# Patient Record
Sex: Male | Born: 1950 | Race: White | Hispanic: No | Marital: Married | Smoking: Former smoker
Health system: Southern US, Community
[De-identification: ages and names within clinical notes are randomized; demographics above are authoritative.]

## PROBLEM LIST (undated history)

## (undated) DIAGNOSIS — E119 Type 2 diabetes mellitus without complications: Secondary | ICD-10-CM

## (undated) DIAGNOSIS — I10 Essential (primary) hypertension: Secondary | ICD-10-CM

## (undated) DIAGNOSIS — G4733 Obstructive sleep apnea (adult) (pediatric): Secondary | ICD-10-CM

## (undated) HISTORY — DX: Type 2 diabetes mellitus without complications: E11.9

---

## 2014-08-18 ENCOUNTER — Ambulatory Visit (INDEPENDENT_AMBULATORY_CARE_PROVIDER_SITE_OTHER): Payer: Self-pay | Admitting: Physician Assistant

## 2014-08-18 VITALS — BP 142/80 | HR 82 | Temp 98.9°F | Resp 18 | Ht 71.25 in | Wt 250.8 lb

## 2014-08-18 DIAGNOSIS — Z0283 Encounter for blood-alcohol and blood-drug test: Secondary | ICD-10-CM

## 2014-08-18 DIAGNOSIS — Z0189 Encounter for other specified special examinations: Secondary | ICD-10-CM

## 2014-08-18 DIAGNOSIS — R1011 Right upper quadrant pain: Secondary | ICD-10-CM
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
## 2014-12-28 ENCOUNTER — Encounter (INDEPENDENT_AMBULATORY_CARE_PROVIDER_SITE_OTHER): Payer: 59 | Admitting: Ophthalmology

## 2014-12-28 DIAGNOSIS — E11311 Type 2 diabetes mellitus with unspecified diabetic retinopathy with macular edema: Secondary | ICD-10-CM

## 2014-12-28 DIAGNOSIS — H43813 Vitreous degeneration, bilateral: Secondary | ICD-10-CM | POA: Diagnosis not present

## 2014-12-28 DIAGNOSIS — E11331 Type 2 diabetes mellitus with moderate nonproliferative diabetic retinopathy with macular edema: Secondary | ICD-10-CM

## 2014-12-28 DIAGNOSIS — H3531 Nonexudative age-related macular degeneration: Secondary | ICD-10-CM | POA: Diagnosis not present

## 2014-12-28 DIAGNOSIS — E11339 Type 2 diabetes mellitus with moderate nonproliferative diabetic retinopathy without macular edema: Secondary | ICD-10-CM | POA: Diagnosis not present

## 2014-12-28 DIAGNOSIS — H2513 Age-related nuclear cataract, bilateral: Secondary | ICD-10-CM

## 2014-12-28 DIAGNOSIS — H35033 Hypertensive retinopathy, bilateral: Secondary | ICD-10-CM | POA: Diagnosis not present

## 2014-12-28 DIAGNOSIS — I10 Essential (primary) hypertension: Secondary | ICD-10-CM

## 2016-01-24 ENCOUNTER — Other Ambulatory Visit: Payer: Self-pay | Admitting: Adult Health

## 2016-01-24 ENCOUNTER — Ambulatory Visit (INDEPENDENT_AMBULATORY_CARE_PROVIDER_SITE_OTHER): Payer: Self-pay

## 2016-01-24 DIAGNOSIS — Z021 Encounter for pre-employment examination: Secondary | ICD-10-CM

## 2018-10-02 ENCOUNTER — Emergency Department (HOSPITAL_COMMUNITY): Payer: No Typology Code available for payment source

## 2018-10-02 ENCOUNTER — Inpatient Hospital Stay (HOSPITAL_COMMUNITY): Payer: No Typology Code available for payment source | Admitting: Anesthesiology

## 2018-10-02 ENCOUNTER — Inpatient Hospital Stay (HOSPITAL_COMMUNITY): Payer: No Typology Code available for payment source

## 2018-10-02 ENCOUNTER — Encounter (HOSPITAL_COMMUNITY): Payer: Self-pay | Admitting: Emergency Medicine

## 2018-10-02 ENCOUNTER — Inpatient Hospital Stay (HOSPITAL_COMMUNITY)
Admission: EM | Admit: 2018-10-02 | Discharge: 2018-10-05 | DRG: 481 | Disposition: A | Discharge status: Skilled Nursing Facility | Payer: No Typology Code available for payment source | Attending: Family Medicine | Admitting: Family Medicine

## 2018-10-02 ENCOUNTER — Other Ambulatory Visit: Payer: Self-pay

## 2018-10-02 ENCOUNTER — Encounter (HOSPITAL_COMMUNITY)
Admission: EM | Disposition: A | Discharge status: Skilled Nursing Facility | Payer: Self-pay | Source: Home / Self Care | Attending: Family Medicine

## 2018-10-02 DIAGNOSIS — R339 Retention of urine, unspecified: Secondary | ICD-10-CM | POA: Diagnosis present

## 2018-10-02 DIAGNOSIS — N179 Acute kidney failure, unspecified: Secondary | ICD-10-CM | POA: Diagnosis not present

## 2018-10-02 DIAGNOSIS — I10 Essential (primary) hypertension: Secondary | ICD-10-CM | POA: Diagnosis present

## 2018-10-02 DIAGNOSIS — Z79899 Other long term (current) drug therapy: Secondary | ICD-10-CM | POA: Diagnosis not present

## 2018-10-02 DIAGNOSIS — S72002A Fracture of unspecified part of neck of left femur, initial encounter for closed fracture: Secondary | ICD-10-CM | POA: Diagnosis present

## 2018-10-02 DIAGNOSIS — I959 Hypotension, unspecified: Secondary | ICD-10-CM | POA: Diagnosis not present

## 2018-10-02 DIAGNOSIS — Z7982 Long term (current) use of aspirin: Secondary | ICD-10-CM

## 2018-10-02 DIAGNOSIS — R0902 Hypoxemia: Secondary | ICD-10-CM | POA: Diagnosis present

## 2018-10-02 DIAGNOSIS — W19XXXA Unspecified fall, initial encounter: Secondary | ICD-10-CM

## 2018-10-02 DIAGNOSIS — M1611 Unilateral primary osteoarthritis, right hip: Secondary | ICD-10-CM | POA: Diagnosis present

## 2018-10-02 DIAGNOSIS — Z7984 Long term (current) use of oral hypoglycemic drugs: Secondary | ICD-10-CM | POA: Diagnosis not present

## 2018-10-02 DIAGNOSIS — Y93K1 Activity, walking an animal: Secondary | ICD-10-CM

## 2018-10-02 DIAGNOSIS — S72142A Displaced intertrochanteric fracture of left femur, initial encounter for closed fracture: Secondary | ICD-10-CM

## 2018-10-02 DIAGNOSIS — E119 Type 2 diabetes mellitus without complications: Secondary | ICD-10-CM

## 2018-10-02 DIAGNOSIS — Z09 Encounter for follow-up examination after completed treatment for conditions other than malignant neoplasm: Secondary | ICD-10-CM

## 2018-10-02 DIAGNOSIS — R296 Repeated falls: Secondary | ICD-10-CM | POA: Diagnosis present

## 2018-10-02 DIAGNOSIS — Z419 Encounter for procedure for purposes other than remedying health state, unspecified: Secondary | ICD-10-CM

## 2018-10-02 DIAGNOSIS — E1140 Type 2 diabetes mellitus with diabetic neuropathy, unspecified: Secondary | ICD-10-CM | POA: Diagnosis present

## 2018-10-02 DIAGNOSIS — G9340 Encephalopathy, unspecified: Secondary | ICD-10-CM | POA: Diagnosis not present

## 2018-10-02 DIAGNOSIS — W1830XA Fall on same level, unspecified, initial encounter: Secondary | ICD-10-CM | POA: Diagnosis present

## 2018-10-02 DIAGNOSIS — F4320 Adjustment disorder, unspecified: Secondary | ICD-10-CM | POA: Diagnosis present

## 2018-10-02 DIAGNOSIS — Z961 Presence of intraocular lens: Secondary | ICD-10-CM | POA: Diagnosis present

## 2018-10-02 DIAGNOSIS — G4733 Obstructive sleep apnea (adult) (pediatric): Secondary | ICD-10-CM | POA: Diagnosis present

## 2018-10-02 HISTORY — PX: FEMUR IM NAIL: SHX1597

## 2018-10-02 HISTORY — DX: Obstructive sleep apnea (adult) (pediatric): G47.33

## 2018-10-02 LAB — CBC WITH DIFFERENTIAL/PLATELET
Abs Immature Granulocytes: 0.11 10*3/uL — ABNORMAL HIGH (ref 0.00–0.07)
Basophils Absolute: 0.1 10*3/uL (ref 0.0–0.1)
Basophils Relative: 1 %
Eosinophils Absolute: 0.4 10*3/uL (ref 0.0–0.5)
Eosinophils Relative: 4 %
HCT: 40.6 % (ref 39.0–52.0)
Hemoglobin: 13.1 g/dL (ref 13.0–17.0)
Immature Granulocytes: 1 %
Lymphocytes Relative: 13 %
Lymphs Abs: 1.1 10*3/uL (ref 0.7–4.0)
MCH: 30.6 pg (ref 26.0–34.0)
MCHC: 32.3 g/dL (ref 30.0–36.0)
MCV: 94.9 fL (ref 80.0–100.0)
Monocytes Absolute: 0.5 10*3/uL (ref 0.1–1.0)
Monocytes Relative: 6 %
NRBC: 0 % (ref 0.0–0.2)
Neutro Abs: 6.5 10*3/uL (ref 1.7–7.7)
Neutrophils Relative %: 75 %
Platelets: 176 10*3/uL (ref 150–400)
RBC: 4.28 MIL/uL (ref 4.22–5.81)
RDW: 14.6 % (ref 11.5–15.5)
WBC: 8.6 10*3/uL (ref 4.0–10.5)

## 2018-10-02 LAB — COMPREHENSIVE METABOLIC PANEL
ALT: 25 U/L (ref 0–44)
AST: 17 U/L (ref 15–41)
Albumin: 3.5 g/dL (ref 3.5–5.0)
Alkaline Phosphatase: 67 U/L (ref 38–126)
Anion gap: 12 (ref 5–15)
BUN: 11 mg/dL (ref 8–23)
CALCIUM: 8.6 mg/dL — AB (ref 8.9–10.3)
CO2: 24 mmol/L (ref 22–32)
Chloride: 103 mmol/L (ref 98–111)
Creatinine, Ser: 0.89 mg/dL (ref 0.61–1.24)
GFR calc Af Amer: >60 mL/min (ref >60)
GFR calc non Af Amer: >60 mL/min (ref >60)
Glucose, Bld: 370 mg/dL — ABNORMAL HIGH (ref 70–99)
Potassium: 4.2 mmol/L (ref 3.5–5.1)
Sodium: 139 mmol/L (ref 135–145)
Total Bilirubin: 0.9 mg/dL (ref 0.3–1.2)
Total Protein: 5.7 g/dL — ABNORMAL LOW (ref 6.5–8.1)

## 2018-10-02 LAB — HEMOGLOBIN A1C
Hgb A1c MFr Bld: 8 % — ABNORMAL HIGH (ref 4.8–5.6)
Mean Plasma Glucose: 182.9 mg/dL

## 2018-10-02 LAB — GLUCOSE, CAPILLARY
Glucose-Capillary: 263 mg/dL — ABNORMAL HIGH (ref 70–99)
Glucose-Capillary: 269 mg/dL — ABNORMAL HIGH (ref 70–99)
Glucose-Capillary: 248 mg/dL — ABNORMAL HIGH (ref 70–99)
Glucose-Capillary: 229 mg/dL — ABNORMAL HIGH (ref 70–99)

## 2018-10-02 LAB — PROTIME-INR
INR: 1.03
Prothrombin Time: 13.4 seconds (ref 11.4–15.2)

## 2018-10-02 LAB — CBG MONITORING, ED: Glucose-Capillary: 360 mg/dL — ABNORMAL HIGH (ref 70–99)

## 2018-10-02 LAB — TYPE AND SCREEN
ABO/RH(D): B POS
Antibody Screen: NEGATIVE

## 2018-10-02 LAB — ABO/RH: ABO/RH(D): B POS

## 2018-10-02 SURGERY — INSERTION, INTRAMEDULLARY ROD, FEMUR
Anesthesia: General | Site: Hip | Laterality: Left

## 2018-10-02 MED ORDER — HYDROMORPHONE HCL 1 MG/ML IJ SOLN
0.2500 mg | INTRAMUSCULAR | Status: DC | PRN
  Administered 2018-10-02 (×4): 0.5 mg via INTRAVENOUS

## 2018-10-02 MED ORDER — LACTATED RINGERS IV SOLN
INTRAVENOUS | Status: DC
  Administered 2018-10-02: 17:00:00 via INTRAVENOUS

## 2018-10-02 MED ORDER — FENTANYL CITRATE (PF) 100 MCG/2ML IJ SOLN
INTRAMUSCULAR | Status: AC
  Administered 2018-10-02: 50 ug via INTRAVENOUS
  Filled 2018-10-02: qty 2

## 2018-10-02 MED ORDER — ROCURONIUM BROMIDE 50 MG/5ML IV SOSY
PREFILLED_SYRINGE | INTRAVENOUS | Status: AC
  Filled 2018-10-02: qty 5

## 2018-10-02 MED ORDER — OXYCODONE HCL 5 MG PO TABS
ORAL_TABLET | ORAL | Status: AC
  Administered 2018-10-02: 10 mg via ORAL
  Filled 2018-10-02: qty 2

## 2018-10-02 MED ORDER — MIDAZOLAM HCL 5 MG/5ML IJ SOLN
INTRAMUSCULAR | Status: DC | PRN
  Administered 2018-10-02: 2 mg via INTRAVENOUS

## 2018-10-02 MED ORDER — MORPHINE SULFATE (PF) 4 MG/ML IV SOLN
4.0000 mg | INTRAVENOUS | Status: AC | PRN
  Administered 2018-10-02 (×2): 4 mg via INTRAVENOUS
  Filled 2018-10-02 (×2): qty 1

## 2018-10-02 MED ORDER — CELECOXIB 200 MG PO CAPS
200.0000 mg | ORAL_CAPSULE | Freq: Two times a day (BID) | ORAL | Status: DC
  Administered 2018-10-02 – 2018-10-03 (×3): 200 mg via ORAL
  Filled 2018-10-02 (×3): qty 1

## 2018-10-02 MED ORDER — GABAPENTIN 300 MG PO CAPS
600.0000 mg | ORAL_CAPSULE | Freq: Three times a day (TID) | ORAL | Status: DC
  Administered 2018-10-02 – 2018-10-03 (×3): 600 mg via ORAL
  Filled 2018-10-02 (×3): qty 2

## 2018-10-02 MED ORDER — CEFAZOLIN SODIUM-DEXTROSE 2-4 GM/100ML-% IV SOLN
2.0000 g | Freq: Four times a day (QID) | INTRAVENOUS | Status: AC
  Administered 2018-10-02 – 2018-10-03 (×2): 2 g via INTRAVENOUS
  Filled 2018-10-02 (×2): qty 100

## 2018-10-02 MED ORDER — CEFAZOLIN SODIUM-DEXTROSE 2-4 GM/100ML-% IV SOLN
2.0000 g | Freq: Four times a day (QID) | INTRAVENOUS | Status: DC
  Filled 2018-10-02 (×2): qty 100

## 2018-10-02 MED ORDER — ASPIRIN 81 MG PO TABS: 81.0000 mg | ORAL_TABLET | Freq: Every day | ORAL | Status: DC

## 2018-10-02 MED ORDER — SUGAMMADEX SODIUM 200 MG/2ML IV SOLN
INTRAVENOUS | Status: DC | PRN
  Administered 2018-10-02: 400 mg via INTRAVENOUS

## 2018-10-02 MED ORDER — SODIUM CHLORIDE 0.9 % IV BOLUS
500.0000 mL | Freq: Once | INTRAVENOUS | Status: AC
  Administered 2018-10-02: 500 mL via INTRAVENOUS

## 2018-10-02 MED ORDER — ROCURONIUM BROMIDE 50 MG/5ML IV SOSY
PREFILLED_SYRINGE | INTRAVENOUS | Status: DC | PRN
  Administered 2018-10-02: 50 mg via INTRAVENOUS

## 2018-10-02 MED ORDER — LIDOCAINE 2% (20 MG/ML) 5 ML SYRINGE
INTRAMUSCULAR | Status: AC
  Filled 2018-10-02: qty 5

## 2018-10-02 MED ORDER — 0.9 % SODIUM CHLORIDE (POUR BTL) OPTIME
TOPICAL | Status: DC | PRN
  Administered 2018-10-02: 1000 mL

## 2018-10-02 MED ORDER — PROPOFOL 10 MG/ML IV BOLUS
INTRAVENOUS | Status: AC
  Filled 2018-10-02: qty 20

## 2018-10-02 MED ORDER — FENTANYL CITRATE (PF) 250 MCG/5ML IJ SOLN
INTRAMUSCULAR | Status: AC
  Filled 2018-10-02: qty 5

## 2018-10-02 MED ORDER — INSULIN ASPART 100 UNIT/ML ~~LOC~~ SOLN
8.0000 [IU] | Freq: Once | SUBCUTANEOUS | Status: AC
  Administered 2018-10-02: 8 [IU] via SUBCUTANEOUS

## 2018-10-02 MED ORDER — HYDROCODONE-ACETAMINOPHEN 5-325 MG PO TABS: 1.0000 | ORAL_TABLET | Freq: Four times a day (QID) | ORAL | Status: DC | PRN

## 2018-10-02 MED ORDER — LACTATED RINGERS IV SOLN
INTRAVENOUS | Status: DC | PRN
  Administered 2018-10-02 (×2): via INTRAVENOUS

## 2018-10-02 MED ORDER — HYDROMORPHONE HCL 1 MG/ML IJ SOLN
INTRAMUSCULAR | Status: AC
  Administered 2018-10-02: 0.5 mg via INTRAVENOUS
  Filled 2018-10-02: qty 1

## 2018-10-02 MED ORDER — CEFAZOLIN SODIUM-DEXTROSE 2-3 GM-%(50ML) IV SOLR
INTRAVENOUS | Status: DC | PRN
  Administered 2018-10-02: 2 g via INTRAVENOUS

## 2018-10-02 MED ORDER — METOCLOPRAMIDE HCL 5 MG/ML IJ SOLN: 5.0000 mg | Freq: Three times a day (TID) | INTRAMUSCULAR | Status: DC | PRN

## 2018-10-02 MED ORDER — EPHEDRINE 5 MG/ML INJ
INTRAVENOUS | Status: AC
  Filled 2018-10-02: qty 20

## 2018-10-02 MED ORDER — MIDAZOLAM HCL 2 MG/2ML IJ SOLN
INTRAMUSCULAR | Status: AC
  Filled 2018-10-02: qty 2

## 2018-10-02 MED ORDER — INSULIN ASPART 100 UNIT/ML ~~LOC~~ SOLN
SUBCUTANEOUS | Status: AC
  Administered 2018-10-02: 8 [IU] via SUBCUTANEOUS
  Filled 2018-10-02: qty 1

## 2018-10-02 MED ORDER — PHENYLEPHRINE 40 MCG/ML (10ML) SYRINGE FOR IV PUSH (FOR BLOOD PRESSURE SUPPORT)
PREFILLED_SYRINGE | INTRAVENOUS | Status: AC
  Filled 2018-10-02: qty 10

## 2018-10-02 MED ORDER — ACETAMINOPHEN 500 MG PO TABS
1000.0000 mg | ORAL_TABLET | Freq: Three times a day (TID) | ORAL | Status: AC
  Administered 2018-10-02 – 2018-10-03 (×3): 1000 mg via ORAL
  Filled 2018-10-02 (×3): qty 2

## 2018-10-02 MED ORDER — SODIUM CHLORIDE 0.9 % IV SOLN
INTRAVENOUS | Status: DC | PRN
  Administered 2018-10-02: 25 ug/min via INTRAVENOUS

## 2018-10-02 MED ORDER — LISINOPRIL 40 MG PO TABS
40.0000 mg | ORAL_TABLET | Freq: Every day | ORAL | Status: DC
  Administered 2018-10-03: 40 mg via ORAL
  Filled 2018-10-02: qty 1

## 2018-10-02 MED ORDER — MENTHOL 3 MG MT LOZG: 1.0000 | LOZENGE | OROMUCOSAL | Status: DC | PRN

## 2018-10-02 MED ORDER — ONDANSETRON HCL 4 MG/2ML IJ SOLN
INTRAMUSCULAR | Status: AC
  Filled 2018-10-02: qty 2

## 2018-10-02 MED ORDER — DOCUSATE SODIUM 100 MG PO CAPS
100.0000 mg | ORAL_CAPSULE | Freq: Two times a day (BID) | ORAL | Status: DC
  Administered 2018-10-02 – 2018-10-05 (×6): 100 mg via ORAL
  Filled 2018-10-02 (×6): qty 1

## 2018-10-02 MED ORDER — FENTANYL CITRATE (PF) 100 MCG/2ML IJ SOLN
25.0000 ug | INTRAMUSCULAR | Status: DC | PRN
  Administered 2018-10-02 (×2): 50 ug via INTRAVENOUS

## 2018-10-02 MED ORDER — FENTANYL CITRATE (PF) 100 MCG/2ML IJ SOLN
INTRAMUSCULAR | Status: DC | PRN
  Administered 2018-10-02 (×4): 50 ug via INTRAVENOUS

## 2018-10-02 MED ORDER — INSULIN ASPART 100 UNIT/ML ~~LOC~~ SOLN
4.0000 [IU] | Freq: Once | SUBCUTANEOUS | Status: AC
  Administered 2018-10-02: 4 [IU] via SUBCUTANEOUS

## 2018-10-02 MED ORDER — SUCCINYLCHOLINE CHLORIDE 200 MG/10ML IV SOSY
PREFILLED_SYRINGE | INTRAVENOUS | Status: AC
  Filled 2018-10-02: qty 10

## 2018-10-02 MED ORDER — METOCLOPRAMIDE HCL 5 MG PO TABS: 5.0000 mg | ORAL_TABLET | Freq: Three times a day (TID) | ORAL | Status: DC | PRN

## 2018-10-02 MED ORDER — LIDOCAINE 2% (20 MG/ML) 5 ML SYRINGE
INTRAMUSCULAR | Status: DC | PRN
  Administered 2018-10-02: 60 mg via INTRAVENOUS

## 2018-10-02 MED ORDER — HYDROMORPHONE HCL 1 MG/ML IJ SOLN
1.0000 mg | INTRAMUSCULAR | Status: DC | PRN
  Administered 2018-10-02 – 2018-10-03 (×3): 1 mg via INTRAVENOUS
  Filled 2018-10-02 (×3): qty 1

## 2018-10-02 MED ORDER — SUCCINYLCHOLINE CHLORIDE 20 MG/ML IJ SOLN
INTRAMUSCULAR | Status: DC | PRN
  Administered 2018-10-02: 120 mg via INTRAVENOUS

## 2018-10-02 MED ORDER — FENTANYL CITRATE (PF) 100 MCG/2ML IJ SOLN
25.0000 ug | Freq: Once | INTRAMUSCULAR | Status: AC
  Administered 2018-10-02: 25 ug via INTRAVENOUS
  Filled 2018-10-02: qty 2

## 2018-10-02 MED ORDER — PROPOFOL 10 MG/ML IV BOLUS
INTRAVENOUS | Status: DC | PRN
  Administered 2018-10-02: 120 mg via INTRAVENOUS

## 2018-10-02 MED ORDER — METHOCARBAMOL 500 MG PO TABS
500.0000 mg | ORAL_TABLET | Freq: Four times a day (QID) | ORAL | Status: DC | PRN
  Administered 2018-10-02 – 2018-10-05 (×5): 500 mg via ORAL
  Filled 2018-10-02 (×5): qty 1

## 2018-10-02 MED ORDER — METHOCARBAMOL 1000 MG/10ML IJ SOLN
500.0000 mg | Freq: Four times a day (QID) | INTRAVENOUS | Status: DC | PRN
  Filled 2018-10-02: qty 5

## 2018-10-02 MED ORDER — INSULIN ASPART 100 UNIT/ML ~~LOC~~ SOLN
0.0000 [IU] | Freq: Three times a day (TID) | SUBCUTANEOUS | Status: DC
  Administered 2018-10-02: 5 [IU] via SUBCUTANEOUS
  Administered 2018-10-03: 11 [IU] via SUBCUTANEOUS
  Administered 2018-10-03: 8 [IU] via SUBCUTANEOUS
  Administered 2018-10-03: 11 [IU] via SUBCUTANEOUS
  Administered 2018-10-04: 5 [IU] via SUBCUTANEOUS
  Administered 2018-10-04 – 2018-10-05 (×3): 8 [IU] via SUBCUTANEOUS
  Administered 2018-10-05: 5 [IU] via SUBCUTANEOUS

## 2018-10-02 MED ORDER — PHENOL 1.4 % MT LIQD: 1.0000 | OROMUCOSAL | Status: DC | PRN

## 2018-10-02 MED ORDER — ONDANSETRON HCL 4 MG/2ML IJ SOLN
4.0000 mg | Freq: Once | INTRAMUSCULAR | Status: AC
  Administered 2018-10-02: 4 mg via INTRAVENOUS
  Filled 2018-10-02: qty 2

## 2018-10-02 MED ORDER — FENTANYL CITRATE (PF) 100 MCG/2ML IJ SOLN
50.0000 ug | INTRAMUSCULAR | Status: AC | PRN
  Administered 2018-10-02 (×2): 50 ug via INTRAVENOUS

## 2018-10-02 MED ORDER — KETOROLAC TROMETHAMINE 30 MG/ML IJ SOLN
INTRAMUSCULAR | Status: AC
  Filled 2018-10-02: qty 1

## 2018-10-02 MED ORDER — ONDANSETRON HCL 4 MG/2ML IJ SOLN
INTRAMUSCULAR | Status: DC | PRN
  Administered 2018-10-02: 4 mg via INTRAVENOUS

## 2018-10-02 MED ORDER — KETOROLAC TROMETHAMINE 15 MG/ML IJ SOLN
INTRAMUSCULAR | Status: DC | PRN
  Administered 2018-10-02: 30 mg via INTRAVENOUS

## 2018-10-02 MED ORDER — ONDANSETRON HCL 4 MG/2ML IJ SOLN: 4.0000 mg | Freq: Four times a day (QID) | INTRAMUSCULAR | Status: DC | PRN

## 2018-10-02 MED ORDER — ACETAMINOPHEN 500 MG PO TABS
1000.0000 mg | ORAL_TABLET | Freq: Once | ORAL | Status: AC
  Administered 2018-10-02: 1000 mg via ORAL

## 2018-10-02 MED ORDER — ASPIRIN 81 MG PO CHEW
81.0000 mg | CHEWABLE_TABLET | Freq: Every day | ORAL | Status: DC
  Administered 2018-10-03 – 2018-10-05 (×3): 81 mg via ORAL
  Filled 2018-10-02 (×3): qty 1

## 2018-10-02 MED ORDER — FENTANYL CITRATE (PF) 100 MCG/2ML IJ SOLN
INTRAMUSCULAR | Status: AC
  Filled 2018-10-02: qty 2

## 2018-10-02 MED ORDER — INSULIN ASPART 100 UNIT/ML ~~LOC~~ SOLN
SUBCUTANEOUS | Status: AC
  Administered 2018-10-02: 4 [IU] via SUBCUTANEOUS
  Filled 2018-10-02: qty 1

## 2018-10-02 MED ORDER — INSULIN ASPART 100 UNIT/ML ~~LOC~~ SOLN
0.0000 [IU] | Freq: Every day | SUBCUTANEOUS | Status: DC
  Administered 2018-10-03: 4 [IU] via SUBCUTANEOUS
  Administered 2018-10-04: 3 [IU] via SUBCUTANEOUS

## 2018-10-02 MED ORDER — OXYCODONE HCL 5 MG PO TABS
5.0000 mg | ORAL_TABLET | ORAL | Status: DC | PRN
  Administered 2018-10-02 – 2018-10-03 (×4): 10 mg via ORAL
  Administered 2018-10-03: 5 mg via ORAL
  Administered 2018-10-04 (×2): 10 mg via ORAL
  Administered 2018-10-05: 5 mg via ORAL
  Filled 2018-10-02 (×4): qty 2
  Filled 2018-10-02: qty 1
  Filled 2018-10-02 (×2): qty 2

## 2018-10-02 MED ORDER — ENOXAPARIN SODIUM 40 MG/0.4ML ~~LOC~~ SOLN
40.0000 mg | SUBCUTANEOUS | Status: DC
  Administered 2018-10-03 – 2018-10-05 (×3): 40 mg via SUBCUTANEOUS
  Filled 2018-10-02 (×3): qty 0.4

## 2018-10-02 MED ORDER — ONDANSETRON HCL 4 MG PO TABS: 4.0000 mg | ORAL_TABLET | Freq: Four times a day (QID) | ORAL | Status: DC | PRN

## 2018-10-02 SURGICAL SUPPLY — 44 items
BIT DRILL 4.3MMS DISTAL GRDTED (BIT) ×1 IMPLANT
BNDG COHESIVE 4X5 TAN STRL (GAUZE/BANDAGES/DRESSINGS) ×3 IMPLANT
BNDG GAUZE ELAST 4 BULKY (GAUZE/BANDAGES/DRESSINGS) ×3 IMPLANT
CLOSURE STERI-STRIP 1/2X4 (GAUZE/BANDAGES/DRESSINGS) ×1
CLSR STERI-STRIP ANTIMIC 1/2X4 (GAUZE/BANDAGES/DRESSINGS) ×2 IMPLANT
COVER MAYO STAND STRL (DRAPES) ×3 IMPLANT
COVER PERINEAL POST (MISCELLANEOUS) ×3 IMPLANT
COVER SURGICAL LIGHT HANDLE (MISCELLANEOUS) ×3 IMPLANT
COVER WAND RF STERILE (DRAPES) ×3 IMPLANT
DRAPE STERI IOBAN 125X83 (DRAPES) ×3 IMPLANT
DRILL 4.3MMS DISTAL GRADUATED (BIT) ×3
DRSG AQUACEL AG ADV 3.5X 4 (GAUZE/BANDAGES/DRESSINGS) ×3 IMPLANT
DRSG MEPILEX BORDER 4X4 (GAUZE/BANDAGES/DRESSINGS) ×9 IMPLANT
DURAPREP 26ML APPLICATOR (WOUND CARE) ×3 IMPLANT
ELECT REM PT RETURN 9FT ADLT (ELECTROSURGICAL) ×3
ELECTRODE REM PT RTRN 9FT ADLT (ELECTROSURGICAL) ×1 IMPLANT
GLOVE BIOGEL PI IND STRL 8 (GLOVE) ×1 IMPLANT
GLOVE BIOGEL PI INDICATOR 8 (GLOVE) ×2
GLOVE BIOGEL PI ORTHO PRO SZ8 (GLOVE)
GLOVE ECLIPSE 8.0 STRL XLNG CF (GLOVE) ×6 IMPLANT
GLOVE PI ORTHO PRO STRL SZ8 (GLOVE) IMPLANT
GLOVE SURG ORTHO 8.0 STRL STRW (GLOVE) IMPLANT
GOWN STRL REUS W/ TWL LRG LVL3 (GOWN DISPOSABLE) ×2 IMPLANT
GOWN STRL REUS W/TWL 2XL LVL3 (GOWN DISPOSABLE) IMPLANT
GOWN STRL REUS W/TWL LRG LVL3 (GOWN DISPOSABLE) ×4
GUIDEPIN 3.2X17.5 THRD DISP (PIN) ×6 IMPLANT
GUIDEWIRE BALL NOSE 100CM (WIRE) ×3 IMPLANT
HFN LH 130 DEG 11MM X 380MM (Orthopedic Implant) ×3 IMPLANT
KIT TURNOVER KIT B (KITS) ×3 IMPLANT
MANIFOLD NEPTUNE II (INSTRUMENTS) IMPLANT
NS IRRIG 1000ML POUR BTL (IV SOLUTION) ×3 IMPLANT
PACK GENERAL/GYN (CUSTOM PROCEDURE TRAY) ×3 IMPLANT
PAD ARMBOARD 7.5X6 YLW CONV (MISCELLANEOUS) ×3 IMPLANT
SCREW ANTI ROTATION 85MM (Screw) ×3 IMPLANT
SCREW BONE CORTICAL 5.0X44 (Screw) ×3 IMPLANT
SCREW DRILL BIT ANIT ROTATION (BIT) ×3 IMPLANT
SCREW LAG 10.5MMX105MM HFN (Screw) ×3 IMPLANT
SUT MNCRL AB 4-0 PS2 18 (SUTURE) ×3 IMPLANT
SUT VIC AB 0 CT1 27 (SUTURE) ×2
SUT VIC AB 0 CT1 27XBRD ANBCTR (SUTURE) ×1 IMPLANT
SUT VIC AB 2-0 CT1 27 (SUTURE) ×2
SUT VIC AB 2-0 CT1 TAPERPNT 27 (SUTURE) ×1 IMPLANT
TOWEL OR 17X24 6PK STRL BLUE (TOWEL DISPOSABLE) ×3 IMPLANT
TOWEL OR 17X26 10 PK STRL BLUE (TOWEL DISPOSABLE) ×3 IMPLANT

## 2018-10-02 NOTE — ED Notes (Signed)
Obtained consent for procedure 

## 2018-10-02 NOTE — Anesthesia Postprocedure Evaluation (Signed)
Anesthesia Post Note  Patient: [name]  Procedure(s) Performed: INTRAMEDULLARY (IM) NAIL FEMORAL (Left Hip)     Patient location during evaluation: PACU Anesthesia Type: General Level of consciousness: awake and alert Pain management: pain level controlled Vital Signs Assessment: post-procedure vital signs reviewed and stable Respiratory status: spontaneous breathing, nonlabored ventilation and respiratory function stable Cardiovascular status: blood pressure returned to baseline and stable Postop Assessment: no apparent nausea or vomiting Anesthetic complications: no    Last Vitals:  Vitals:   10/02/18 1635 10/02/18 1647  BP: 127/66 (!) 148/80  Pulse: (!) 105 (!) 102  Resp: 16 18  Temp:  36.7 C  SpO2: 98% 92%    Last Pain:  Vitals:   10/02/18 1647  TempSrc: Oral  PainSc:                  Vernette Moise,W. EDMOND

## 2018-10-02 NOTE — Consult Note (Signed)
Reason for Consult:Left hip fx Referring Physician: E Dickie Johnathan Le is an 68 y.o. male.  HPI: Johnathan Le was walking his dog this morning when he began to fall backwards and couldn't catch his balance. He fell on the pavement and had immediate left hip pain. He could not get up or ambulate. He dragged himself back to his home where his wife found him and called 911. X-rays showed a left intertroch hip fx and orthopedic surgery was consulted. He denies any syncope/presyncope/vertigo but had a similar fall about a week ago. He is retired.  Past Medical History:  Diagnosis Date  . Diabetes mellitus without complication (HCC)     History reviewed. No pertinent surgical history.  History reviewed. No pertinent family history.  Social History:  reports that he has never smoked. He has never used smokeless tobacco. He reports current alcohol use. He reports that he does not use drugs.  Allergies:  Allergies  Allergen Reactions  . Pregabalin Nausea And Vomiting    Medications: I have reviewed the patient's current medications.  Results for orders placed or performed during the hospital encounter of 10/02/18 (from the past 48 hour(s))  CBG monitoring, ED     Status: Abnormal   Collection Time: 10/02/18  7:54 AM  Result Value Ref Range   Glucose-Capillary 360 (H) 70 - 99 mg/dL  CBC WITH DIFFERENTIAL     Status: Abnormal   Collection Time: 10/02/18  8:05 AM  Result Value Ref Range   WBC 8.6 4.0 - 10.5 K/uL   RBC 4.28 4.22 - 5.81 MIL/uL   Hemoglobin 13.1 13.0 - 17.0 g/dL   HCT 16.1 09.6 - 04.5 %   MCV 94.9 80.0 - 100.0 fL   MCH 30.6 26.0 - 34.0 pg   MCHC 32.3 30.0 - 36.0 g/dL   RDW 40.9 81.1 - 91.4 %   Platelets 176 150 - 400 K/uL   nRBC 0.0 0.0 - 0.2 %   Neutrophils Relative % 75 %   Neutro Abs 6.5 1.7 - 7.7 K/uL   Lymphocytes Relative 13 %   Lymphs Abs 1.1 0.7 - 4.0 K/uL   Monocytes Relative 6 %   Monocytes Absolute 0.5 0.1 - 1.0 K/uL   Eosinophils Relative 4 %   Eosinophils Absolute 0.4 0.0 - 0.5 K/uL   Basophils Relative 1 %   Basophils Absolute 0.1 0.0 - 0.1 K/uL   Immature Granulocytes 1 %   Abs Immature Granulocytes 0.11 (H) 0.00 - 0.07 K/uL    Comment: Performed at [facility], [address] [place], [place] [address]  Protime-INR     Status: None   Collection Time: 10/02/18  8:05 AM  Result Value Ref Range   Prothrombin Time 13.4 11.4 - 15.2 seconds   INR 1.03     Comment: Performed at [facility], [address], [place], [place] [address]  Comprehensive metabolic panel     Status: Abnormal   Collection Time: 10/02/18  8:05 AM  Result Value Ref Range   Sodium 139 135 - 145 mmol/L   Potassium 4.2 3.5 - 5.1 mmol/L   Chloride 103 98 - 111 mmol/L   CO2 24 22 - 32 mmol/L   Glucose, Bld 370 (H) 70 - 99 mg/dL   BUN 11 8 - 23 mg/dL   Creatinine, Ser 8.65 0.61 - 1.24 mg/dL   Calcium 8.6 (L) 8.9 - 10.3 mg/dL   Total Protein 5.7 (L) 6.5 - 8.1 g/dL  Albumin 3.5 3.5 - 5.0 g/dL   AST 17 15 - 41 U/L   ALT 25 0 - 44 U/L   Alkaline Phosphatase 67 38 - 126 U/L   Total Bilirubin 0.9 0.3 - 1.2 mg/dL   GFR calc non Af Amer >60 >60 mL/min   GFR calc Af Amer >60 >60 mL/min   Anion gap 12 5 - 15    Comment: Performed at [facility], [address] [place], [place] [address]  Type and screen [facility]     Status: None   Collection Time: 10/02/18  8:14 AM  Result Value Ref Range   ABO/RH(D) B POS    Antibody Screen NEG    Sample Expiration 10/05/2018   ABO/Rh     Status: None   Collection Time: 10/02/18  8:14 AM  Result Value Ref Range   ABO/RH(D) B POS     Dg Hip Unilat With Pelvis 2-3 Views Left  Result Date: 10/02/2018 CLINICAL DATA:  Fall with left hip deformity.  Initial encounter. EXAM: DG HIP (WITH OR WITHOUT PELVIS) 2-3V LEFT COMPARISON:  None. FINDINGS: Acute comminuted intratrochanteric fracture of the proximal left femur noted with displacement. The femoral head shows normal alignment without  dislocation. The rest of the bony pelvis is intact without fracture or diastasis. 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 No bony lesions. IMPRESSION: Comminuted and displaced intertrochanteric fracture of the left hip. Electronically Signed   By: Irish Lack M.D.   On: 10/02/2018 09:25    Review of Systems  Constitutional: Negative for weight loss.  HENT: Positive for tinnitus. Negative for ear discharge, ear pain and hearing loss.   Eyes: Negative for blurred vision, double vision, photophobia and pain.  Respiratory: Negative for cough, sputum production and shortness of breath.   Cardiovascular: Negative for chest pain.  Gastrointestinal: Negative for abdominal pain, nausea and vomiting.  Genitourinary: Negative for dysuria, flank pain, frequency and urgency.  Musculoskeletal: Positive for falls and joint pain (Left hip). Negative for back pain, myalgias and neck pain.  Neurological: Negative for dizziness, tingling, sensory change, focal weakness, loss of consciousness and headaches.  Endo/Heme/Allergies: Does not bruise/bleed easily.  Psychiatric/Behavioral: Negative for depression, memory loss and substance abuse. The patient is not nervous/anxious.    Blood pressure 131/67, pulse 99, temperature 98.2 F (36.8 C), temperature source Oral, resp. rate 19, height 5\' 11"  (1.803 m), weight 113.4 kg, SpO2 98 %. Physical Exam  Constitutional: He appears well-developed and well-nourished. No distress.  HENT:  Head: Normocephalic and atraumatic.  Eyes: Conjunctivae are normal. Right eye exhibits no discharge. Left eye exhibits no discharge. No scleral icterus.  Neck: Normal range of motion.  Cardiovascular: Normal rate and regular rhythm.  Respiratory: Effort normal. No respiratory distress.  Musculoskeletal:     Comments: LLE No traumatic wounds, ecchymosis, or rash  Severe TTP hip, leg shortened and externally rotated  No knee or ankle effusion  Knee  stable to varus/ valgus and anterior/posterior stress  Sens DPN, TN paresthetic, SPN absent  Motor EHL, ext, flex, evers 5/5  DP 1+, PT 1+, No significant edema  Neurological: He is alert.  Skin: Skin is warm and dry. He is not diaphoretic.  Psychiatric: He has a normal mood and affect. His behavior is normal.    Assessment/Plan: Fall Left hip fx -- Plan for IMN today by either Dr. Everardo Pacific or Dr. Jena Gauss. NPO until then. DM HTN    Freeman Caldron,  PA-C Orthopedic Surgery [phone] 10/02/2018, 10:13 AM

## 2018-10-02 NOTE — Transfer of Care (Addendum)
Immediate Anesthesia Transfer of Care Note  Patient: Johnathan Le  Procedure(s) Performed: INTRAMEDULLARY (IM) NAIL FEMORAL (Left Hip)  Patient Location: PACU  Anesthesia Type:General  Level of Consciousness: awake, alert  and oriented  Airway & Oxygen Therapy: Patient Spontanous Breathing and Patient connected to nasal cannula oxygen  Post-op Assessment: Report given to RN, Post -op Vital signs reviewed and stable and Patient moving all extremities X 4  Post vital signs: Reviewed and stable  Last Vitals:  Vitals Value Taken Time  BP 134/76 10/02/2018  2:18 PM  Temp    Pulse 100 10/02/2018  2:20 PM  Resp 17 10/02/2018  2:20 PM  SpO2 91 % 10/02/2018  2:20 PM  Vitals shown include unvalidated device data.  Last Pain:  Vitals:   10/02/18 1217  TempSrc:   PainSc: 8          Complications: No apparent anesthesia complications

## 2018-10-02 NOTE — Anesthesia Preprocedure Evaluation (Addendum)
Anesthesia Evaluation  Patient identified by MRN, date of birth, ID band Patient awake    Reviewed: Allergy & Precautions, H&P , NPO status , Patient's Chart, lab work & pertinent test results  Airway Mallampati: II  TM Distance: >3 FB Neck ROM: Full    Dental no notable dental hx. (+) Poor Dentition, Dental Advisory Given   Pulmonary neg pulmonary ROS,    Pulmonary exam normal breath sounds clear to auscultation       Cardiovascular negative cardio ROS   Rhythm:Regular Rate:Normal     Neuro/Psych negative neurological ROS  negative psych ROS   GI/Hepatic negative GI ROS, Neg liver ROS,   Endo/Other  diabetes, Type 2, Oral Hypoglycemic Agents  Renal/GU negative Renal ROS  negative genitourinary   Musculoskeletal   Abdominal   Peds  Hematology negative hematology ROS (+)   Anesthesia Other Findings   Reproductive/Obstetrics negative OB ROS                            Anesthesia Physical Anesthesia Plan  ASA: II  Anesthesia Plan: General   Post-op Pain Management:    Induction: Intravenous  PONV Risk Score and Plan: 3 and Ondansetron, Midazolam and Diphenhydramine  Airway Management Planned: Oral ETT  Additional Equipment:   Intra-op Plan:   Post-operative Plan: Extubation in OR  Informed Consent: I have reviewed the patients History and Physical, chart, labs and discussed the procedure including the risks, benefits and alternatives for the proposed anesthesia with the patient or authorized representative who has indicated his/her understanding and acceptance.     Dental advisory given  Plan Discussed with: CRNA  Anesthesia Plan Comments:         Anesthesia Quick Evaluation

## 2018-10-02 NOTE — Progress Notes (Signed)
Orthopedic Tech Progress Note Patient Details:  Johnathan Le [birth year] [id number] Installed Bed Frame      Post Interventions Patient Tolerated: Well Instructions Provided: Care of device, Adjustment of device   Efton Thomley J Nyema Hachey 10/02/2018, 6:14 PM

## 2018-10-02 NOTE — Progress Notes (Signed)
Pt. Refused cpap. 

## 2018-10-02 NOTE — Plan of Care (Signed)
  Problem: Education: Goal: Knowledge of General Education information will improve Description: Including pain rating scale, medication(s)/side effects and non-pharmacologic comfort measures Outcome: Progressing   Problem: Clinical Measurements: Goal: Will remain free from infection Outcome: Progressing   Problem: Clinical Measurements: Goal: Diagnostic test results will improve Outcome: Progressing   
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
## 2018-10-02 NOTE — ED Triage Notes (Signed)
Pt was walking his dogs this morning and tripped and fell. Pt denies syncope. Denies dizziness. Pt has left hip deformity, left foot is shortened and rotated outward. Pt drug himself to get back to his house. EMS gave fentanyl. Pt alert and oriented. CBG 244 (pt has not eaten or had diabetic meds this morning), BP 150/70, HR 88.

## 2018-10-02 NOTE — Anesthesia Procedure Notes (Signed)
Procedure Name: Intubation Date/Time: 10/02/2018 1:00 PM Performed by: Neldon Newport, CRNA Pre-anesthesia Checklist: Timeout performed, Patient being monitored, Suction available, Emergency Drugs available and Patient identified Patient Re-evaluated:Patient Re-evaluated prior to induction Oxygen Delivery Method: Circle system utilized Preoxygenation: Pre-oxygenation with 100% oxygen Induction Type: IV induction Ventilation: Mask ventilation without difficulty Laryngoscope Size: Mac and 4 Grade View: Grade III Tube type: Oral Tube size: 7.5 mm Number of attempts: 1 Placement Confirmation: breath sounds checked- equal and bilateral and positive ETCO2 Secured at: 23 cm Tube secured with: Tape Dental Injury: Teeth and Oropharynx as per pre-operative assessment

## 2018-10-02 NOTE — ED Notes (Addendum)
Pt's sats 87-88% on room air. Placed pt on 2L [place]. Pt denies SOB, but states he used to wear CPAP at home. Pt laying back in bed due to suspected hip fx.

## 2018-10-02 NOTE — ED Provider Notes (Signed)
[facility]   CSN: [id number] Arrival date & time: 10/02/18  0747     History   Chief Complaint Chief Complaint  Patient presents with  . Fall    HPI Johnathan Le is a 68 y.o. male.  HPI   68 year old male with hx OF DM presents wtih fall while walking the dog today with ihp pain.  when he fell backwards with severe pain to the left hip.  Reports he was able to drive himself back to the house, however was unable to walk.  Denies loss of consciousness, head trauma, headaches, use of anticoagulation, neck pain, back pain, chest pain or abdominal pain.  Denies focal numbness or weakness.  Denies dizziness, lightheadedness or syncope today.  Reports that he will occasionally have episodes of dizziness.  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  Past Medical History:  Diagnosis Date  . Diabetes mellitus without complication (HCC)   . OSA (obstructive sleep apnea)    not on CPAP    Patient Active Problem List   Diagnosis Date Noted  . Closed left hip fracture, initial encounter (HCC) 10/02/2018  . Adjustment disorder 10/02/2018  . OSA (obstructive sleep apnea)   . Diabetes mellitus without complication (HCC)     History reviewed. No pertinent surgical history.      Home Medications    Prior to Admission medications   Medication Sig Start Date End Date Taking? Authorizing Provider  Alpha-Lipoic Acid 300 MG CAPS Take 300 mg by mouth 2 (two) times daily.   Yes [provider]  aspirin 81 MG tablet Take 81 mg by mouth daily.   Yes [provider]  Cholecalciferol 25 MCG (1000 UT) capsule Take 1,000 Units by mouth daily.   Yes [provider]  Co-Enzyme Q-10 100 MG CAPS Take 100 mg by mouth daily.   Yes [provider]  gabapentin (NEURONTIN) 600 MG tablet Take 600 mg by mouth 3 (three) times daily.   Yes [provider]  lisinopril (PRINIVIL,ZESTRIL) 40 MG tablet Take 40 mg by mouth daily.   Yes [provider]  metFORMIN (GLUCOPHAGE) 1000 MG tablet Take 1,000 mg by mouth 2 (two) times daily with a meal.   Yes [provider]  Multiple Vitamin (MULTIVITAMIN) capsule Take 1 capsule by mouth daily.   Yes [provider]  Omega-3 Fatty Acids (FISH OIL) 1000 MG CAPS Take 1,000 mg by mouth daily.   Yes [provider]  sitaGLIPtin (JANUVIA) 100 MG tablet Take 100 mg by mouth daily. 11/23/12  Yes [provider]  vitamin B-12 (CYANOCOBALAMIN) 500 MCG tablet Take 500 mcg by mouth daily.   Yes [provider]    Family History History reviewed. No pertinent family history.  Social History Social History   Tobacco Use  . Smoking status: Never Smoker  . Smokeless tobacco: Never Used  Substance Use Topics  . Alcohol use: Yes    Alcohol/week: 0.0 standard drinks    Comment: rare  . Drug use: No     Allergies   Pregabalin   Review of Systems Review of Systems  Constitutional: Negative for fever.  HENT: Negative for sore throat.   Eyes: Negative for visual disturbance.  Respiratory: Negative for shortness of breath.   Cardiovascular: Negative for chest pain.  Gastrointestinal: Negative for abdominal pain, nausea and vomiting.  Genitourinary: Negative for difficulty urinating.  Musculoskeletal: Positive  for arthralgias. Negative for back pain and neck stiffness.  Skin: Negative for rash.  Neurological: Negative for syncope and headaches.     Physical Exam Updated Vital Signs BP 122/60 (BP Location: Right Arm)   Pulse 90   Temp 98.4 F (36.9 C) (Oral)   Resp 20   Ht 5\' 11"  (1.803 m)   Wt 113.4 kg   SpO2 93%   BMI 34.87 kg/m   Physical Exam Vitals signs and nursing note reviewed.  Constitutional:      General: He is not in acute distress.    Appearance: He is well-developed. He is not diaphoretic.  HENT:     Head:  Normocephalic and atraumatic.  Eyes:     General: No visual field deficit.    Conjunctiva/sclera: Conjunctivae normal.  Neck:     Musculoskeletal: Normal range of motion.  Cardiovascular:     Rate and Rhythm: Normal rate and regular rhythm.     Heart sounds: Normal heart sounds. No murmur. No friction rub. No gallop.   Pulmonary:     Effort: Pulmonary effort is normal. No respiratory distress.     Breath sounds: Normal breath sounds. No wheezing or rales.  Abdominal:     General: There is no distension.     Palpations: Abdomen is soft.     Tenderness: There is no abdominal tenderness. There is no guarding.  Musculoskeletal:     Left hip: He exhibits decreased range of motion, tenderness and bony tenderness. He exhibits no laceration.     Cervical back: He exhibits no bony tenderness.     Right foot: Normal range of motion and normal capillary refill.     Left foot: Normal range of motion and normal capillary refill.     Comments: Left leg shortened and externally rotated  Skin:    General: Skin is warm and dry.  Neurological:     Mental Status: He is alert and oriented to person, place, and time.     GCS: GCS eye subscore is 4. GCS verbal subscore is 5. GCS motor subscore is 6.     Cranial Nerves: No cranial nerve deficit, dysarthria or facial asymmetry.     Sensory: Sensation is intact. Sensory deficit: bilateral lower extremities with neuropathy.     Motor: Motor function is intact.      ED Treatments / Results  Labs (all labs ordered are listed, but only abnormal results are displayed) Labs Reviewed  CBC WITH DIFFERENTIAL/PLATELET - Abnormal; Notable for the following components:      Result Value   Abs Immature Granulocytes 0.11 (*)    All other components within normal limits  COMPREHENSIVE METABOLIC PANEL - Abnormal; Notable for the following components:   Glucose, Bld 370 (*)    Calcium 8.6 (*)    Total Protein 5.7 (*)    All other components within normal limits    HEMOGLOBIN A1C - Abnormal; Notable for the following components:   Hgb A1c MFr Bld 8.0 (*)    All other components within normal limits  GLUCOSE, CAPILLARY - Abnormal; Notable for the following components:   Glucose-Capillary 263 (*)    All other components within normal limits  GLUCOSE, CAPILLARY - Abnormal; Notable for the following components:   Glucose-Capillary 269 (*)    All other components within normal limits  GLUCOSE, CAPILLARY - Abnormal; Notable for the following components:   Glucose-Capillary 248 (*)    All other components within normal limits  GLUCOSE, CAPILLARY - Abnormal;  Notable for the following components:   Glucose-Capillary 229 (*)    All other components within normal limits  CBG MONITORING, ED - Abnormal; Notable for the following components:   Glucose-Capillary 360 (*)    All other components within normal limits  PROTIME-INR  HIV ANTIBODY (ROUTINE TESTING W REFLEX)  CBC  BASIC METABOLIC PANEL  TYPE AND SCREEN  ABO/RH    EKG None  Radiology Dg Chest Portable 1 View  Result Date: 10/02/2018 CLINICAL DATA:  Acute intratrochanteric left hip fracture. EXAM: PORTABLE CHEST 1 VIEW COMPARISON:  01/23/2001 FINDINGS: Top-normal heart size. Normal mediastinal contours. There is no evidence of pulmonary edema, consolidation, pneumothorax, nodule or pleural fluid. The visualized skeletal structures are unremarkable. IMPRESSION: No active disease. Electronically Signed   By: Irish Lack M.D.   On: 10/02/2018 10:29   Dg C-arm 1-60 Min  Result Date: 10/02/2018 CLINICAL DATA:  Intraoperative localization for ORIF of intertrochanteric left hip fracture. EXAM: LEFT FEMUR 2 VIEWS; DG C-ARM 61-120 MIN COMPARISON:  Prior radiograph from earlier the same day. FINDINGS: Multiple spot intraoperative fluoroscopic views for ORIF of the comminuted displaced intertrochanteric left hip fracture seen. Assessment of IM nail with proximal vessel interlocking screws. Hardware well  position. No adverse features. Fluoroscopy time equals 1 minutes and 41 seconds. IMPRESSION: Intraoperative fluoroscopic localization for ORIF of comminuted intertrochanteric left hip fracture. Electronically Signed   By: Rise Mu M.D.   On: 10/02/2018 15:07   Dg Hip Port Unilat With Pelvis 1v Left  Result Date: 10/02/2018 CLINICAL DATA:  Initial evaluation status post ORIF of left hip fracture. EXAM: DG HIP (WITH OR WITHOUT PELVIS) 1V PORT LEFT COMPARISON:  Prior radiograph from earlier the same day. FINDINGS: Sequelae of interval ORIF of previously identified comminuted displaced intertrochanteric fracture of the left hip. Placement of IM nail with proximal and distal interlocking screws. Hardware well positioned. Intertrochanteric fracture in near anatomic alignment. No adverse features. IMPRESSION: Sequelae of interval ORIF for comminuted intratrochanteric fracture of the left hip. No adverse features. Electronically Signed   By: Rise Mu M.D.   On: 10/02/2018 15:05   Dg Hip Unilat With Pelvis 2-3 Views Left  Result Date: 10/02/2018 CLINICAL DATA:  Fall with left hip deformity.  Initial encounter. EXAM: DG HIP (WITH OR WITHOUT PELVIS) 2-3V LEFT COMPARISON:  None. FINDINGS: Acute comminuted intratrochanteric fracture of the proximal left femur noted with displacement. The femoral head shows normal alignment without dislocation. The rest of the bony pelvis is intact without fracture or diastasis. 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 No bony lesions. IMPRESSION: Comminuted and displaced intertrochanteric fracture of the left hip. Electronically Signed   By: Irish Lack M.D.   On: 10/02/2018 09:25   Dg Femur Min 2 Views Left  Result Date: 10/02/2018 CLINICAL DATA:  Intraoperative localization for ORIF of intertrochanteric left hip fracture. EXAM: LEFT FEMUR 2 VIEWS; DG C-ARM 61-120 MIN COMPARISON:  Prior radiograph from earlier the same day.  FINDINGS: Multiple spot intraoperative fluoroscopic views for ORIF of the comminuted displaced intertrochanteric left hip fracture seen. Assessment of IM nail with proximal vessel interlocking screws. Hardware well position. No adverse features. Fluoroscopy time equals 1 minutes and 41 seconds. IMPRESSION: Intraoperative fluoroscopic localization for ORIF of comminuted intertrochanteric left hip fracture. Electronically Signed   By: Rise Mu M.D.   On: 10/02/2018 15:07    Procedures Procedures (including critical care time)  Medications Ordered in ED Medications  lisinopril (PRINIVIL,ZESTRIL) tablet 40 mg ( Oral MAR  Unhold 10/02/18 1645)  gabapentin (NEURONTIN) capsule 600 mg (600 mg Oral Given 10/02/18 2122)  insulin aspart (novoLOG) injection 0-15 Units (5 Units Subcutaneous Given 10/02/18 1714)  HYDROmorphone (DILAUDID) injection 1 mg (1 mg Intravenous Given 10/02/18 1741)  methocarbamol (ROBAXIN) tablet 500 mg (500 mg Oral Given 10/02/18 1741)    Or  methocarbamol (ROBAXIN) 500 mg in dextrose 5 % 50 mL IVPB ( Intravenous See Alternative 10/02/18 1741)  insulin aspart (novoLOG) injection 0-5 Units (0 Units Subcutaneous Not Given 10/02/18 2157)  aspirin chewable tablet 81 mg ( Oral MAR Unhold 10/02/18 1645)  lactated ringers infusion ( Intravenous Stopped 10/02/18 1842)  enoxaparin (LOVENOX) injection 40 mg (has no administration in time range)  docusate sodium (COLACE) capsule 100 mg (100 mg Oral Given 10/02/18 2122)  ondansetron (ZOFRAN) tablet 4 mg (has no administration in time range)    Or  ondansetron (ZOFRAN) injection 4 mg (has no administration in time range)  metoCLOPramide (REGLAN) tablet 5-10 mg (has no administration in time range)    Or  metoCLOPramide (REGLAN) injection 5-10 mg (has no administration in time range)  menthol-cetylpyridinium (CEPACOL) lozenge 3 mg (has no administration in time range)    Or  phenol (CHLORASEPTIC) mouth spray 1 spray (has no administration  in time range)  oxyCODONE (Oxy IR/ROXICODONE) immediate release tablet 5-10 mg (10 mg Oral Given 10/02/18 2122)  celecoxib (CELEBREX) capsule 200 mg (200 mg Oral Given 10/02/18 2122)  acetaminophen (TYLENOL) tablet 1,000 mg (1,000 mg Oral Given 10/02/18 2123)  ceFAZolin (ANCEF) IVPB 2g/100 mL premix ( Intravenous Stopped 10/02/18 1912)  morphine 4 MG/ML injection 4 mg (4 mg Intravenous Given 10/02/18 0919)  ondansetron (ZOFRAN) injection 4 mg (4 mg Intravenous Given 10/02/18 0814)  sodium chloride 0.9 % bolus 500 mL (0 mLs Intravenous Stopped 10/02/18 0916)  fentaNYL (SUBLIMAZE) injection 25 mcg (25 mcg Intravenous Given 10/02/18 1005)  insulin aspart (novoLOG) injection 4 Units (4 Units Subcutaneous Given 10/02/18 1210)  fentaNYL (SUBLIMAZE) injection 50-100 mcg (50 mcg Intravenous Given 10/02/18 1217)  acetaminophen (TYLENOL) tablet 1,000 mg (1,000 mg Oral Given 10/02/18 1237)  insulin aspart (novoLOG) injection 8 Units (8 Units Subcutaneous Given 10/02/18 1511)     Initial Impression / Assessment and Plan / ED Course  I have reviewed the triage vital signs and the nursing notes.  Pertinent labs & imaging results that were available during my care of the patient were reviewed by me and considered in my medical decision making (see chart for details).       68 year old male with hx OF DM presents wtih fall while walking the dog today with hip pain. No syncope or dizziness. No neurologic changes, doubt CVA. No other acute illness.  Denies other areas of injury, including no headache, head trauma, or neck pain.  XR confirms hip fracture. Pt NV intact.  Discussed with Orthopedics, Dr. Everardo Pacific to take pt to the OR toay. Dr. Ophelia Charter, hospitalist to admit.  Final Clinical Impressions(s) / ED Diagnoses   Final diagnoses:  Fall, initial encounter  Closed displaced intertrochanteric fracture of left femur, initial encounter [facility])    ED Discharge Orders    None       Alvira Monday, MD 10/02/18  2207

## 2018-10-02 NOTE — Op Note (Addendum)
Orthopaedic Surgery Operative Note (CSN: [id number])  Johnathan Le  04-Sep-1950 Date of Surgery: 10/02/2018   Diagnoses:  Left Intertrochanteric femur fracture   Procedure: Left cephalomedullary nail   Operative Finding Successful completion of planned procedure.  Reasonable bone quality, good distal interlock.  Head wanted to spin during insertion but we had a derotational wire in place to prevent this.  Derotational screw placed due to the unstable nature of the fracture.  Implants: [employer]omet Affixus 11x380 L, 105 cephallomedullary screw, 85 derotational screw, 44 mm interlock  Post-operative plan: The patient will be WBAT.  The patient will be readmitted to floor.  DVT prophylaxis lovenox 40mg  qd.  Pain control with PRN pain medication preferring oral medicines.  Follow up plan will be scheduled in approximately 7 days for incision check and XR.  Post-Op Diagnosis: Same Surgeons:Primary: Bjorn Pippin, MD Assistants: Location: [facility] OR ROOM 12 Anesthesia: General Antibiotics: Ancef 2g preop Tourniquet time: * No tourniquets in log * Estimated Blood Loss: minimal Complications: None Specimens: None Implants: Implant Name Type Inv. Item Serial No. Manufacturer Lot No. LRB No. Used Action  HFN LH 130 DEG X - SN/A  Orthopedic Implant HFN LH 130 DEG X N/A  [employer]ON(ORTH,TRAU,BIO,SG) [id number] Left 1 Implanted  HFN LAG SCREW 10.5MM X - [id number] Screw HFN LAG SCREW 10.5MM X  [employer]ON(ORTH,TRAU,BIO,SG) [id number] Left 1 Implanted  SCREW ANTI ROTATION - [id number] Screw SCREW ANTI ROTATION  [employer]ON(ORTH,TRAU,BIO,SG) [id number] Left 1 Implanted  SCREW BONE CORTICAL 5.0X44 - [id number] Screw SCREW BONE CORTICAL 5.0X44  [employer]ON(ORTH,TRAU,BIO,SG) [id number] Left 1 Implanted    Indications for Surgery:   Johnathan Le is a 68 y.o. male with fall resulting in L hip fracture.  Benefits and risks of operative and nonoperative management were  discussed prior to surgery with patient/guardian(s) and informed consent form was completed.  Specific risks including infection, need for additional surgery, malunion, nonunion, painful hardware.   Procedure:   The patient was identified in the preoperative holding area where the surgical site was marked. The patient was taken to the OR where a procedural timeout was called and the above noted anesthesia was induced.  The patient was positioned supine on fracture table.  Preoperative antibiotics were dosed.  The patient's left hip was prepped and draped in the usual sterile fashion.  A second preoperative timeout was called.      The patient was placed supine on a fracture table and appropriate reduction was obtained and visualized on fluoroscopy prior to the beginning of the procedure.  We made an incision proximal to the greater trochanter and dissected down through the fascia.  We then carefully placed our starting awl localizing under fluoroscopy prior to advancing the awl into the bone and sliding a ball-tipped guidewire through the awl into the femoral canal.  The wire was passed to an appropriate level at the fascial scar of the distal femur and measurement was obtained proximally using fluoroscopy.  We selected a length of nail noted above.  Entry reamer was used needed but the nail was unreamed.  At this point we placed our nail localizing under fluoroscopy that it was at the appropriate level prior to using the outrigger device to pass a wire and then the cephalo-medullary screw.   We placed a derotational wire and screw to avoid spinning the neck fragment.  The screw was locked proximally to avoid over collapse.  We took final shots at the proximal femur  and then used perfect circle technique to place one distal interlock screw.  Final pictures were obtained.  The wounds were thoroughly irrigated closed in a multilayer fashion with absorable sutures.  A sterile dressing was placed.  The patient  was awoken from general anesthesia and taken to the PACU in stable condition without complication.

## 2018-10-03 ENCOUNTER — Inpatient Hospital Stay (HOSPITAL_COMMUNITY): Payer: No Typology Code available for payment source

## 2018-10-03 ENCOUNTER — Encounter (HOSPITAL_COMMUNITY): Payer: Self-pay | Admitting: Orthopaedic Surgery

## 2018-10-03 LAB — BASIC METABOLIC PANEL
Anion gap: 9 (ref 5–15)
BUN: 11 mg/dL (ref 8–23)
CHLORIDE: 104 mmol/L (ref 98–111)
CO2: 26 mmol/L (ref 22–32)
Calcium: 8.3 mg/dL — ABNORMAL LOW (ref 8.9–10.3)
Creatinine, Ser: 0.87 mg/dL (ref 0.61–1.24)
GFR calc Af Amer: >60 mL/min (ref >60)
GFR calc non Af Amer: >60 mL/min (ref >60)
Glucose, Bld: 245 mg/dL — ABNORMAL HIGH (ref 70–99)
Potassium: 4.1 mmol/L (ref 3.5–5.1)
Sodium: 139 mmol/L (ref 135–145)

## 2018-10-03 LAB — CBC
HCT: 36 % — ABNORMAL LOW (ref 39.0–52.0)
Hemoglobin: 11.7 g/dL — ABNORMAL LOW (ref 13.0–17.0)
MCH: 31.2 pg (ref 26.0–34.0)
MCHC: 32.5 g/dL (ref 30.0–36.0)
MCV: 96 fL (ref 80.0–100.0)
Platelets: 210 10*3/uL (ref 150–400)
RBC: 3.75 MIL/uL — ABNORMAL LOW (ref 4.22–5.81)
RDW: 15.2 % (ref 11.5–15.5)
WBC: 12.9 10*3/uL — ABNORMAL HIGH (ref 4.0–10.5)
nRBC: 0 % (ref 0.0–0.2)

## 2018-10-03 LAB — GLUCOSE, CAPILLARY
GLUCOSE-CAPILLARY: 324 mg/dL — AB (ref 70–99)
Glucose-Capillary: 251 mg/dL — ABNORMAL HIGH (ref 70–99)
Glucose-Capillary: 347 mg/dL — ABNORMAL HIGH (ref 70–99)
Glucose-Capillary: 311 mg/dL — ABNORMAL HIGH (ref 70–99)

## 2018-10-03 LAB — HIV ANTIBODY (ROUTINE TESTING W REFLEX): HIV Screen 4th Generation wRfx: NONREACTIVE

## 2018-10-03 MED ORDER — INSULIN GLARGINE 100 UNIT/ML ~~LOC~~ SOLN: 10.0000 [IU] | Freq: Every day | SUBCUTANEOUS | Status: DC

## 2018-10-03 MED ORDER — ACETAMINOPHEN 500 MG PO TABS
1000.0000 mg | ORAL_TABLET | Freq: Three times a day (TID) | ORAL | Status: DC
  Administered 2018-10-03 – 2018-10-05 (×6): 1000 mg via ORAL
  Filled 2018-10-03 (×6): qty 2

## 2018-10-03 MED ORDER — ACETAMINOPHEN 500 MG PO TABS: 1000.0000 mg | ORAL_TABLET | Freq: Three times a day (TID) | ORAL | 0 refills | Status: AC

## 2018-10-03 MED ORDER — PHENYLEPHRINE 40 MCG/ML (10ML) SYRINGE FOR IV PUSH (FOR BLOOD PRESSURE SUPPORT)
PREFILLED_SYRINGE | INTRAVENOUS | Status: AC
  Filled 2018-10-03: qty 20

## 2018-10-03 MED ORDER — GABAPENTIN 400 MG PO CAPS
800.0000 mg | ORAL_CAPSULE | Freq: Three times a day (TID) | ORAL | Status: DC
  Administered 2018-10-03 – 2018-10-04 (×4): 800 mg via ORAL
  Filled 2018-10-03 (×4): qty 2

## 2018-10-03 MED ORDER — ENOXAPARIN SODIUM 40 MG/0.4ML ~~LOC~~ SOLN: 40.0000 mg | SUBCUTANEOUS | 1 refills | Status: DC

## 2018-10-03 MED ORDER — EPHEDRINE 5 MG/ML INJ
INTRAVENOUS | Status: AC
  Filled 2018-10-03: qty 20

## 2018-10-03 MED ORDER — INSULIN NPH (HUMAN) (ISOPHANE) 100 UNIT/ML ~~LOC~~ SUSP
20.0000 [IU] | Freq: Two times a day (BID) | SUBCUTANEOUS | Status: DC
  Administered 2018-10-03: 20 [IU] via SUBCUTANEOUS
  Filled 2018-10-03 (×2): qty 10

## 2018-10-03 MED ORDER — OXYCODONE HCL 5 MG PO TABS: ORAL_TABLET | ORAL | 0 refills | Status: AC

## 2018-10-03 MED ORDER — INSULIN ASPART 100 UNIT/ML ~~LOC~~ SOLN
6.0000 [IU] | Freq: Three times a day (TID) | SUBCUTANEOUS | Status: DC
  Administered 2018-10-03: 6 [IU] via SUBCUTANEOUS

## 2018-10-03 MED ORDER — INSULIN NPH (HUMAN) (ISOPHANE) 100 UNIT/ML ~~LOC~~ SUSP
7.0000 [IU] | Freq: Two times a day (BID) | SUBCUTANEOUS | Status: DC
  Administered 2018-10-03: 7 [IU] via SUBCUTANEOUS
  Filled 2018-10-03: qty 10

## 2018-10-03 NOTE — Clinical Social Work Note (Signed)
Clinical Social Work Assessment  Patient Details  Name: Johnathan Le MRN: [id number] Date of Birth: [birth year]  Date of referral:  10/03/18               Reason for consult:  Discharge Planning                Permission sought to share information with:  Case Manager, Facility Medical sales representative, Family Supports Permission granted to share information::  Yes, Verbal Permission Granted  Name::     Naval architect::  SNFs  Relationship::  spouse  Contact Information:  [phone]  Housing/Transportation Living arrangements for the past 2 months:  Single Family Home Source of Information:  Patient Patient Interpreter Needed:  None Criminal Activity/Legal Involvement Pertinent to Current Situation/Hospitalization:  No - Comment as needed Significant Relationships:  Spouse Lives with:  Self Do you feel safe going back to the place where you live?  No Need for family participation in patient care:  Yes (Comment)  Care giving concerns:  CSW received referral for possible SNF placement at time of discharge. Spoke with patient regarding possibility of SNF placement . Patient's wife   is currently unable to care for him at their home given patient's current needs and fall risk.  Patient expressed understanding of PT recommendation and are agreeable to SNF placement at time of discharge. CSW to continue to follow and assist with discharge planning needs.     Social Worker assessment / plan:  Spoke with patient concerning possibility of rehab at SNF before returning home.    Employment status:  Retired Database administrator PT Recommendations:  Skilled Nursing Facility Information / Referral to community resources:  Skilled Nursing Facility  Patient/Family's Response to care:  Patient recognizes need for rehab before returning home and are agreeable to a SNF in [place]. They report no preferences at this time and are accepting of using either VA benefits or  Medicare for Skilled Nursing Facility  . CSW will send SNF referrals and then relay to patient beds avail. CSW explained insurance authorization process. Patient's family reported that they want patient to get stronger to be able to come back home.    Patient/Family's Understanding of and Emotional Response to Diagnosis, Current Treatment, and Prognosis:  Patient/family is realistic regarding therapy needs and expressed being hopeful for SNF placement. Patient expressed understanding of CSW role and discharge process as well as medical condition. No questions/concerns about plan or treatment.    Emotional Assessment Appearance:  Appears stated age Attitude/Demeanor/Rapport:  Gracious Affect (typically observed):  Accepting Orientation:  Oriented to Self, Oriented to Place, Oriented to  Time, Oriented to Situation Alcohol / Substance use:  Not Applicable Psych involvement (Current and /or in the community):  No (Comment)  Discharge Needs  Concerns to be addressed:  Discharge Planning Concerns Readmission within the last 30 days:  No Current discharge risk:  Dependent with Mobility Barriers to Discharge:  Continued Medical Work up   [employer], LCSW 10/03/2018, 1:03 PM

## 2018-10-03 NOTE — Discharge Instructions (Signed)
°  Ramond Marrow MD, MPH [facility] [address] [phone] (tel)   [phone] (fax)   POST-OPERATIVE INSTRUCTIONS   WOUND CARE  You may shower on Post-Op Day #7. OK to shower with dressing on.  EXERCISES Follow the instructions of your therapist.  No specific exercises necessary outside of this.  POST-OP MEDICINES A multi-modal approach will be used to treat your pain. Oxycodone - This is a strong narcotic, to be used only on an "as needed" basis for pain. Acetaminophen 500mg - A non-narcotic pain medicine.  Use 1000mg  three times a day for the first 14 days after surgery   Zofran 4mg  - This is an anti-nausea medicine to be used only if you are having nausea or vomitting.  FOLLOW-UP If you develop a Fever (>101.5), Redness or Drainage from the surgical incision site, please call our office to arrange for an evaluation. Please call the office to schedule a follow-up appointment for your suture removal, 10-14 days post-operatively.  IF YOU HAVE ANY QUESTIONS, PLEASE FEEL FREE TO CALL OUR OFFICE.  HELPFUL INFORMATION You should wean off your narcotic medicines as soon as you are able.  Most patients will be off or using minimal narcotics before their first postop appointment.   We suggest you use the pain medication the first night prior to going to bed, in order to ease any pain when the anesthesia wears off. You should avoid taking pain medications on an empty stomach as it will make you nauseous.  Do not drink alcoholic beverages or take illicit drugs when taking pain medications.  Pain medication may make you constipated.  Below are a few solutions to try in this order: Decrease the amount of pain medication if you aren't having pain. Drink lots of decaffeinated fluids. Drink prune juice and/or each dried prunes  If the first 3 don't work start with additional solutions Take Colace - an over-the-counter stool softener Take Senokot - an  over-the-counter laxative Take Miralax - a stronger over-the-counter laxative

## 2018-10-03 NOTE — Evaluation (Signed)
Physical Therapy Evaluation Patient Details Name: Johnathan Le MRN: [id number] DOB: 06/21/1951 Today's Date: 10/03/2018   History of Present Illness  Pt is a 68 y.o. with PMH consisting of DM, neuropathy, and OSA. He presented to the ED with a L hip fx sustained in a fall while walking his dog. He underwent IM nailing 10-02-18.     Clinical Impression  Pt admitted with above diagnosis. Pt currently with functional limitations due to the deficits listed below (see PT Problem List). PTA pt lived at home with his wife, independent with ADLs and mobility. He does report recent history of multiple falls. On eval, pt required max assist bed mobility, mod assist sit to stand in stedy, and total assist bed to recliner transfer in stedy. Pt will benefit from skilled PT to increase their independence and safety with mobility to allow discharge to the venue listed below.       Follow Up Recommendations SNF    Equipment Recommendations  Other (comment)(TBD at next venue)    Recommendations for Other Services       Precautions / Restrictions Precautions Precautions: Fall      Mobility  Bed Mobility Overal bed mobility: Needs Assistance Bed Mobility: Supine to Sit     Supine to sit: Max assist     General bed mobility comments: +rail, cues for sequencing, increased time and effort, use of bed pad to scoot to EOB  Transfers Overall transfer level: Needs assistance   Transfers: Sit to/from Stand;Stand Pivot Transfers Sit to Stand: Mod assist Stand pivot transfers: Total assist       General transfer comment: Mod assist to power up in stedy, multi attempts. Pt unable to perform sit to stand with RW. Total assist bed to recliner transfer in stedy.  Ambulation/Gait             General Gait Details: unable  Stairs            Wheelchair Mobility    Modified Novicki (Stroke Patients Only)       Balance                                              Pertinent Vitals/Pain Pain Assessment: 0-10 Pain Score: 9  Pain Location: L hip during mobility Pain Descriptors / Indicators: Grimacing;Guarding;Sharp;Stabbing Pain Intervention(s): Monitored during session;Repositioned;Premedicated before session    Home Living Family/patient expects to be discharged to:: Private residence Living Arrangements: Spouse/significant other Available Help at Discharge: Family;Available PRN/intermittently(wife works at [employer] ) Type of Home: House Home Access: Stairs to enter Entrance Stairs-Rails: Doctor, general practice of Steps: 2 Home Layout: Two level;Able to live on main level with bedroom/bathroom Home Equipment: Dan Humphreys - 2 wheels;Cane - single point;Crutches      Prior Function Level of Independence: Independent         Comments: occassional use of cane     Hand Dominance        Extremity/Trunk Assessment   Upper Extremity Assessment Upper Extremity Assessment: Overall WFL for tasks assessed    Lower Extremity Assessment Lower Extremity Assessment: RLE deficits/detail;LLE deficits/detail RLE Sensation: history of peripheral neuropathy LLE Deficits / Details: s/p IM nail LLE Sensation: history of peripheral neuropathy       Communication   Communication: No difficulties  Cognition Arousal/Alertness: Awake/alert Behavior During Therapy: WFL for tasks assessed/performed Overall Cognitive Status: Within Functional  Limits for tasks assessed                                        General Comments General comments (skin integrity, edema, etc.): Pt on 2 L O2 throughout session.    Exercises     Assessment/Plan    PT Assessment Patient needs continued PT services  PT Problem List Decreased strength;Decreased balance;Pain;Decreased mobility;Decreased knowledge of use of DME;Impaired sensation;Decreased activity tolerance       PT Treatment Interventions DME instruction;Functional  mobility training;Balance training;Patient/family education;Gait training;Therapeutic activities;Therapeutic exercise    PT Goals (Current goals can be found in the Care Plan section)  Acute Rehab PT Goals Patient Stated Goal: decrease pain and move better PT Goal Formulation: With patient Time For Goal Achievement: 10/17/18 Potential to Achieve Goals: Good    Frequency Min 3X/week   Barriers to discharge        Co-evaluation               AM-PAC PT "6 Clicks" Mobility  Outcome Measure Help needed turning from your back to your side while in a flat bed without using bedrails?: A Lot Help needed moving from lying on your back to sitting on the side of a flat bed without using bedrails?: A Lot Help needed moving to and from a bed to a chair (including a wheelchair)?: A Lot Help needed standing up from a chair using your arms (e.g., wheelchair or bedside chair)?: A Lot Help needed to walk in hospital room?: Total Help needed climbing 3-5 steps with a railing? : Total 6 Click Score: 10    End of Session Equipment Utilized During Treatment: Gait belt;Oxygen Activity Tolerance: Patient limited by pain Patient left: in chair;with call bell/phone within reach Nurse Communication: Mobility status PT Visit Diagnosis: Other abnormalities of gait and mobility (R26.89);Pain;Difficulty in walking, not elsewhere classified (R26.2);Repeated falls (R29.6) Pain - Right/Left: Left Pain - part of body: Hip    Time: 5670-1410 PT Time Calculation (min) (ACUTE ONLY): 25 min   Charges:   PT Evaluation $PT Eval Moderate Complexity: 1 Mod PT Treatments $Gait Training: 8-22 mins        Aida Raider, PT  Office # [phone] Pager [phone]   Ilda Foil 10/03/2018, 9:30 AM

## 2018-10-03 NOTE — Progress Notes (Signed)
PROGRESS NOTE    Johnathan SanderMichael Le  AVW:[id number] DOB: 02/11/1951 DOA: 10/02/2018 PCP: [facility]   Brief Narrative:  Johnathan SanderMichael Le is Johnathan Le 68 y.o. male with medical history significant of DM with h/o neuropathy and OSA presenting with Johnathan Le fall.  The patient got up about 6 AM and went out to walk his dogs.  He suffered an apparent mechanical fall and fell backwards, landing on his left hip. He dragged himself back up to his porch and his wife found him as she was leaving to go to work.  He is having significant left hip pain.  Assessment & Plan:   Principal Problem:   Closed left hip fracture, initial encounter (HCC) Active Problems:   OSA (obstructive sleep apnea)   Diabetes mellitus without complication (HCC)   Adjustment disorder   Left hip fracture -Mechanical fall resulting in hip fracture -s/p IM nail on 1/30 -lovenox for DVT ppx -schedule APAP, oxy prn, dilaudid prn -bowel regimen  -PT recommending SNF, will discuss with social work Per Ortho Weightbearing: WBAT LLE  Insicional and dressing care: OK to remove dressings 7 days postop, leave steri strips and leave open to air with dry gauze PRN Orthopedic device(s): none Showering: PRN VTE prophylaxis: Lovenox 40mg  qd x6 weeks, if ambulating well will transition to aspirin in clinic Pain control: PRN meds, minimize narcs Follow - up plan: 2 weeks with XR in clinic  Recurrent Falls Reports this is 2nd fall within about Johnathan Le week, didn't trip on anything, just fell backwards Denies focal weakness, LH, or vertiginous symptoms Neuro exam today without focal deficits This may be related to his 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 Will follow up head CT given his recurrent falls which have been new over the past week PT/OT  DM -A1c 8.0 -hold Glucophage, jardiance  -start NPH 7 units BID today (of note, he says he uses 70/30 60 units twice daily - will confirm with pharmacy) -Cover with moderate-scale SSI  OSA -Not on  CPAP -He may benefit from outpatient resumption -Will order as inpt  Adjustment disorder -He is struggling.  -Due to his neuropathy, he had to stop riding his motorcycle.  He had done this for 50 years and misses it dearly.   -He also was forced to retire from his job.   -He is finding it difficult to cope with these life changes. -He may benefit from outpatient psych support but is not currently suicidal.  DVT prophylaxis: lovenox Code Status: full  Family Communication: none at bedside Disposition Plan: pending SNF placement   Consultants:   orthopedics  Procedures:   IM nail by ortho 1/30  Antimicrobials:  Anti-infectives (From admission, onward)   Start     Dose/Rate Route Frequency Ordered Stop   10/02/18 1900  ceFAZolin (ANCEF) IVPB 2g/100 mL premix     2 g 200 mL/hr over 30 Minutes Intravenous Every 6 hours 10/02/18 1811 10/03/18 0036   10/02/18 1700  ceFAZolin (ANCEF) IVPB 2g/100 mL premix  Status:  Discontinued     2 g 200 mL/hr over 30 Minutes Intravenous Every 6 hours 10/02/18 1657 10/02/18 1811     Subjective: C/o pain. Asking why he's been falling recently. Happened last week and then this episode. Not before. Denies focal weakness, LH, dizziness.  Objective: Vitals:   10/03/18 0027 10/03/18 0607 10/03/18 0820 10/03/18 0835  BP: 134/62 134/66 123/60 123/60  Pulse: 96 92 94   Resp: 16  20   Temp: 99.3 F (37.4 C) (!) 97.4  F (36.3 C) 98.8 F (37.1 C)   TempSrc: Oral Oral Oral   SpO2: 96% 98% 95%   Weight:      Height:        Intake/Output Summary (Last 24 hours) at 10/03/2018 1021 Last data filed at 10/03/2018 0820 Gross per 24 hour  Intake 1836.93 ml  Output 1250 ml  Net 586.93 ml   Filed Weights   10/02/18 0801  Weight: 113.4 kg    Examination:  General exam: Appears calm and comfortable  Respiratory system: Clear to auscultation. Respiratory effort normal. Cardiovascular system: S1 & S2 heard, RRR.  Gastrointestinal system:  Abdomen is nondistended, soft and nontender. Central nervous system: Alert and oriented. No focal neurological deficits.  Moves all extremities with equal strength (except LLE due to pain) Extremities: LLE with intact dressing Skin: No rashes, lesions or ulcers Psychiatry: Judgement and insight appear normal. Mood & affect appropriate.     Data Reviewed: I have personally reviewed following labs and imaging studies  CBC: Recent Labs  Lab 10/02/18 0805 10/03/18 0255  WBC 8.6 12.9*  NEUTROABS 6.5  --   HGB 13.1 11.7*  HCT 40.6 36.0*  MCV 94.9 96.0  PLT 176 210   Basic Metabolic Panel: Recent Labs  Lab 10/02/18 0805 10/03/18 0255  NA 139 139  K 4.2 4.1  CL 103 104  CO2 24 26  GLUCOSE 370* 245*  BUN 11 11  CREATININE 0.89 0.87  CALCIUM 8.6* 8.3*   GFR: Estimated Creatinine Clearance: 108.4 mL/min (by C-G formula based on SCr of 0.87 mg/dL). Liver Function Tests: Recent Labs  Lab 10/02/18 0805  AST 17  ALT 25  ALKPHOS 67  BILITOT 0.9  PROT 5.7*  ALBUMIN 3.5   No results for input(s): LIPASE, AMYLASE in the last 168 hours. No results for input(s): AMMONIA in the last 168 hours. Coagulation Profile: Recent Labs  Lab 10/02/18 0805  INR 1.03   Cardiac Enzymes: No results for input(s): CKTOTAL, CKMB, CKMBINDEX, TROPONINI in the last 168 hours. BNP (last 3 results) No results for input(s): PROBNP in the last 8760 hours. HbA1C: Recent Labs    10/02/18 0805  HGBA1C 8.0*   CBG: Recent Labs  Lab 10/02/18 1149 10/02/18 1426 10/02/18 1649 10/02/18 2153 10/03/18 0604  GLUCAP 263* 269* 248* 229* 251*   Lipid Profile: No results for input(s): CHOL, HDL, LDLCALC, TRIG, CHOLHDL, LDLDIRECT in the last 72 hours. Thyroid Function Tests: No results for input(s): TSH, T4TOTAL, FREET4, T3FREE, THYROIDAB in the last 72 hours. Anemia Panel: No results for input(s): VITAMINB12, FOLATE, FERRITIN, TIBC, IRON, RETICCTPCT in the last 72 hours. Sepsis Labs: No results  for input(s): PROCALCITON, LATICACIDVEN in the last 168 hours.  No results found for this or any previous visit (from the past 240 hour(s)).       Radiology Studies: Dg Chest Portable 1 View  Result Date: 10/02/2018 CLINICAL DATA:  Acute intratrochanteric left hip fracture. EXAM: PORTABLE CHEST 1 VIEW COMPARISON:  01/23/2001 FINDINGS: Top-normal heart size. Normal mediastinal contours. There is no evidence of pulmonary edema, consolidation, pneumothorax, nodule or pleural fluid. The visualized skeletal structures are unremarkable. IMPRESSION: No active disease. Electronically Signed   By: Irish Lack M.D.   On: 10/02/2018 10:29   Dg C-arm 1-60 Min  Result Date: 10/02/2018 CLINICAL DATA:  Intraoperative localization for ORIF of intertrochanteric left hip fracture. EXAM: LEFT FEMUR 2 VIEWS; DG C-ARM 61-120 MIN COMPARISON:  Prior radiograph from earlier the same day. FINDINGS: Multiple spot intraoperative  fluoroscopic views for ORIF of the comminuted displaced intertrochanteric left hip fracture seen. Assessment of IM nail with proximal vessel interlocking screws. Hardware well position. No adverse features. Fluoroscopy time equals 1 minutes and 41 seconds. IMPRESSION: Intraoperative fluoroscopic localization for ORIF of comminuted intertrochanteric left hip fracture. Electronically Signed   By: Rise MuBenjamin  McClintock M.D.   On: 10/02/2018 15:07   Dg Hip Port Unilat With Pelvis 1v Left  Result Date: 10/02/2018 CLINICAL DATA:  Initial evaluation status post ORIF of left hip fracture. EXAM: DG HIP (WITH OR WITHOUT PELVIS) 1V PORT LEFT COMPARISON:  Prior radiograph from earlier the same day. FINDINGS: Sequelae of interval ORIF of previously identified comminuted displaced intertrochanteric fracture of the left hip. Placement of IM nail with proximal and distal interlocking screws. Hardware well positioned. Intertrochanteric fracture in near anatomic alignment. No adverse features. IMPRESSION: Sequelae  of interval ORIF for comminuted intratrochanteric fracture of the left hip. No adverse features. Electronically Signed   By: Rise MuBenjamin  McClintock M.D.   On: 10/02/2018 15:05   Dg Hip Unilat With Pelvis 2-3 Views Left  Result Date: 10/02/2018 CLINICAL DATA:  Fall with left hip deformity.  Initial encounter. EXAM: DG HIP (WITH OR WITHOUT PELVIS) 2-3V LEFT COMPARISON:  None. FINDINGS: Acute comminuted intratrochanteric fracture of the proximal left femur noted with displacement. The femoral head shows normal alignment without dislocation. The rest of the bony pelvis is intact without fracture or diastasis. 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 No bony lesions. IMPRESSION: Comminuted and displaced intertrochanteric fracture of the left hip. Electronically Signed   By: Irish LackGlenn  Yamagata M.D.   On: 10/02/2018 09:25   Dg Femur Min 2 Views Left  Result Date: 10/02/2018 CLINICAL DATA:  Intraoperative localization for ORIF of intertrochanteric left hip fracture. EXAM: LEFT FEMUR 2 VIEWS; DG C-ARM 61-120 MIN COMPARISON:  Prior radiograph from earlier the same day. FINDINGS: Multiple spot intraoperative fluoroscopic views for ORIF of the comminuted displaced intertrochanteric left hip fracture seen. Assessment of IM nail with proximal vessel interlocking screws. Hardware well position. No adverse features. Fluoroscopy time equals 1 minutes and 41 seconds. IMPRESSION: Intraoperative fluoroscopic localization for ORIF of comminuted intertrochanteric left hip fracture. Electronically Signed   By: Rise MuBenjamin  McClintock M.D.   On: 10/02/2018 15:07        Scheduled Meds: . acetaminophen  1,000 mg Oral Q8H  . aspirin  81 mg Oral Daily  . celecoxib  200 mg Oral BID  . docusate sodium  100 mg Oral BID  . enoxaparin (LOVENOX) injection  40 mg Subcutaneous Q24H  . gabapentin  600 mg Oral TID  . insulin aspart  0-15 Units Subcutaneous TID WC  . insulin aspart  0-5 Units Subcutaneous QHS   . insulin glargine  10 Units Subcutaneous QHS  . lisinopril  40 mg Oral Daily   Continuous Infusions: . lactated ringers Stopped (10/02/18 1842)  . methocarbamol (ROBAXIN) IV       LOS: 1 day    Time spent: over 30 min    Lacretia Nicksaldwell Powell, MD [facility] Pager AMION  If 7PM-7AM, please contact night-coverage [email or web address] Password [email or web address] 10/03/2018, 10:21 AM

## 2018-10-03 NOTE — Progress Notes (Signed)
Nutrition Brief Note  Received consult from the Hip Fracture protocol for assessment of nutrition status. Weight has been stable, intake is good. Nutrition focused physical exam completed.  No muscle or subcutaneous fat depletion noticed.  Body mass index is 34.87 kg/m. Patient meets criteria for obesity based on current BMI.   Current diet order is CHO modified, patient is consuming 100% of meals at this time. Labs and medications reviewed.   No nutrition interventions warranted at this time. If nutrition issues arise, please consult RD.   Joaquin Courts, RD, LDN, CNSC Pager [phone] After Hours Pager [phone]

## 2018-10-03 NOTE — NC FL2 (Signed)
[place] MEDICAID FL2 LEVEL OF CARE SCREENING TOOL     IDENTIFICATION  Patient Name: Johnathan Le Birthdate: May 14, 1951 Sex: male Admission Date (Current Location): 10/02/2018  [facility] and [place] Number:  Producer, television/film/video and Address:  [facility], [address] [place], [place] [address]      Provider Number: [id number]  Attending Physician Name and Address:  Zigmund Daniel., *  Relative Name and Phone Number:  Nicholos Johns (spouse) [phone]    Current Level of Care: Hospital Recommended Level of Care: Skilled Nursing Facility Prior Approval Number:    Date Approved/Denied:   PASRR Number: [id number]  Discharge Plan: SNF    Current Diagnoses: Patient Active Problem List   Diagnosis Date Noted  . Closed left hip fracture, initial encounter (HCC) 10/02/2018  . Adjustment disorder 10/02/2018  . OSA (obstructive sleep apnea)   . Diabetes mellitus without complication (HCC)     Orientation RESPIRATION BLADDER Height & Weight     Self, Time, Situation, Place  O2(nasal cannula 2L/min) Continent Weight: 113.4 kg Height:  5\' 11"  (180.3 cm)  BEHAVIORAL SYMPTOMS/MOOD NEUROLOGICAL BOWEL NUTRITION STATUS      Continent Diet(see discharge summary)  AMBULATORY STATUS COMMUNICATION OF NEEDS Skin   Extensive Assist Verbally Other (Comment), Surgical wounds(skin tear left arm, left thigh closed surgical wound incision)                       Personal Care Assistance Level of Assistance  Bathing, Feeding, Dressing, Total care Bathing Assistance: Maximum assistance Feeding assistance: Independent Dressing Assistance: Maximum assistance Total Care Assistance: Maximum assistance   Functional Limitations Info  Sight, Hearing, Speech Sight Info: Impaired Hearing Info: Impaired Speech Info: Adequate    SPECIAL CARE FACTORS FREQUENCY  PT (By licensed PT), OT (By licensed OT)     PT Frequency: min 5x weekly OT Frequency: min 5x  weekly            Contractures Contractures Info: Not present    Additional Factors Info  Code Status, Allergies Code Status Info: full Allergies Info: Pregabalin           Current Medications (10/03/2018):  This is the current hospital active medication list Current Facility-Administered Medications  Medication Dose Route Frequency Provider Last Rate Last Dose  . acetaminophen (TYLENOL) tablet 1,000 mg  1,000 mg Oral Q8H Zigmund Daniel., MD      . aspirin chewable tablet 81 mg  81 mg Oral Daily Ramond Marrow T, MD   81 mg at 10/03/18 0836  . celecoxib (CELEBREX) capsule 200 mg  200 mg Oral BID Ramond Marrow T, MD   200 mg at 10/03/18 0835  . docusate sodium (COLACE) capsule 100 mg  100 mg Oral BID Ramond Marrow T, MD   100 mg at 10/03/18 0835  . enoxaparin (LOVENOX) injection 40 mg  40 mg Subcutaneous Q24H Ramond Marrow T, MD   40 mg at 10/03/18 0836  . gabapentin (NEURONTIN) capsule 800 mg  800 mg Oral TID Zigmund Daniel., MD      . HYDROmorphone (DILAUDID) injection 1 mg  1 mg Intravenous Q2H PRN Bjorn Pippin, MD   1 mg at 10/03/18 0835  . insulin aspart (novoLOG) injection 0-15 Units  0-15 Units Subcutaneous TID WC Bjorn Pippin, MD   8 Units at 10/03/18 [phone]  . insulin aspart (novoLOG) injection 0-5 Units  0-5 Units Subcutaneous QHS Bjorn Pippin, MD      .  insulin NPH Human (HUMULIN N,NOVOLIN N) injection 7 Units  7 Units Subcutaneous BID AC & HS Zigmund Daniel., MD      . lactated ringers infusion   Intravenous Continuous Bjorn Pippin, MD   Stopped at 10/02/18 1842  . lisinopril (PRINIVIL,ZESTRIL) tablet 40 mg  40 mg Oral Daily Ramond Marrow T, MD   40 mg at 10/03/18 0836  . menthol-cetylpyridinium (CEPACOL) lozenge 3 mg  1 lozenge Oral PRN Bjorn Pippin, MD       Or  . phenol (CHLORASEPTIC) mouth spray 1 spray  1 spray Mouth/Throat PRN Bjorn Pippin, MD      . methocarbamol (ROBAXIN) tablet 500 mg  500 mg Oral Q6H PRN Bjorn Pippin, MD   500 mg at 10/03/18 2409    Or  . methocarbamol (ROBAXIN) 500 mg in dextrose 5 % 50 mL IVPB  500 mg Intravenous Q6H PRN Bjorn Pippin, MD      . metoCLOPramide (REGLAN) tablet 5-10 mg  5-10 mg Oral Q8H PRN Bjorn Pippin, MD       Or  . metoCLOPramide (REGLAN) injection 5-10 mg  5-10 mg Intravenous Q8H PRN Bjorn Pippin, MD      . ondansetron (ZOFRAN) tablet 4 mg  4 mg Oral Q6H PRN Bjorn Pippin, MD       Or  . ondansetron (ZOFRAN) injection 4 mg  4 mg Intravenous Q6H PRN Ramond Marrow T, MD      . oxyCODONE (Oxy IR/ROXICODONE) immediate release tablet 5-10 mg  5-10 mg Oral Q4H PRN Bjorn Pippin, MD   10 mg at 10/03/18 0605     Discharge Medications: Please see discharge summary for a list of discharge medications.  Relevant Imaging Results:  Relevant Lab Results:   Additional Information SSN: [id number]  Gildardo Griffes, LCSW

## 2018-10-03 NOTE — Progress Notes (Signed)
ORTHOPAEDIC PROGRESS NOTE  s/p Procedure(s): INTRAMEDULLARY (IM) NAIL FEMORAL  L  SUBJECTIVE: Reports mild pain about operative site. No chest pain. No SOB. No nausea/vomiting. No other complaints.  OBJECTIVE: Pe: left lower extremity: incision CDI, leg lengths equal, warm well perfused foot, intact EHL/TA/GSC   Vitals:   10/03/18 0027 10/03/18 0607  BP: 134/62 134/66  Pulse: 96 92  Resp: 16   Temp: 99.3 F (37.4 C) (!) 97.4 F (36.3 C)  SpO2: 96% 98%     ASSESSMENT: Johnathan Le is a 68 y.o. male doing well postoperatively.  PLAN: Weightbearing: WBAT LLE  Insicional and dressing care: OK to remove dressings 7 days postop, leave steri strips and leave open to air with dry gauze PRN Orthopedic device(s): none Showering: PRN VTE prophylaxis: Lovenox 40mg  qd x6 weeks, if ambulating well will transition to aspirin in clinic Pain control: PRN meds, minimize narcs Follow - up plan: 2 weeks with XR in clinic Contact information:  Weekdays 8-5 Ramond Marrow MD [phone], After hours and holidays please check [email or web address] for group call information for [facility]

## 2018-10-03 NOTE — Progress Notes (Signed)
Pt refusing CPAP at this time. Advised pt to notify for RT if he changes his mind.  

## 2018-10-03 NOTE — Progress Notes (Signed)
Inpatient Diabetes Program Recommendations  AACE/ADA: New Consensus Statement on Inpatient Glycemic Control (2015)  Target Ranges:  Prepandial:   less than 140 mg/dL      Peak postprandial:   less than 180 mg/dL (1-2 hours)      Critically ill patients:  140 - 180 mg/dL   Lab Results  Component Value Date   GLUCAP 347 (H) 10/03/2018   HGBA1C 8.0 (H) 10/02/2018    Review of Glycemic Control Results for Guido SanderRANKIN, Chan (MRN [id number]) as of 10/03/2018 13:25  Ref. Range 10/02/2018 16:49 10/02/2018 21:53 10/03/2018 06:04 10/03/2018 11:21  Glucose-Capillary Latest Ref Range: 70 - 99 mg/dL 409248 (H) 811229 (H) 914251 (H) 347 (H)   Diabetes history: DM 2 Outpatient Diabetes medications:  Novolog 70/30- 60 units bid, Jardiance 5 mg daily, Metformin 1000 mg bid Current orders for Inpatient glycemic control:  NPH 7 units bid, Novolog moderate tid with meals and HS  Inpatient Diabetes Program Recommendations:     Note NPH started.  May need to increase NPH to 20 units bid and add Novolog meal coverage 6 units tid with meals ( this is less than 1/2 of home dose).  Will follow.   Thanks  Beryl MeagerJenny Johnwilliam Shepperson, RN, BC-ADM Inpatient Diabetes Coordinator Pager [phone] (8a-5p)

## 2018-10-04 ENCOUNTER — Inpatient Hospital Stay (HOSPITAL_COMMUNITY): Payer: No Typology Code available for payment source

## 2018-10-04 LAB — URINALYSIS, ROUTINE W REFLEX MICROSCOPIC
Bacteria, UA: NONE SEEN
Bilirubin Urine: NEGATIVE
Glucose, UA: >=500 mg/dL — AB
Hgb urine dipstick: NEGATIVE
Ketones, ur: 5 mg/dL — AB
Leukocytes, UA: NEGATIVE
Nitrite: NEGATIVE
Protein, ur: NEGATIVE mg/dL
Specific Gravity, Urine: 1.025 (ref 1.005–1.030)
pH: 5 (ref 5.0–8.0)

## 2018-10-04 LAB — BASIC METABOLIC PANEL
ANION GAP: 9 (ref 5–15)
BUN: 22 mg/dL (ref 8–23)
CALCIUM: 8.5 mg/dL — AB (ref 8.9–10.3)
CO2: 27 mmol/L (ref 22–32)
Chloride: 99 mmol/L (ref 98–111)
Creatinine, Ser: 1.64 mg/dL — ABNORMAL HIGH (ref 0.61–1.24)
GFR calc Af Amer: 50 mL/min — ABNORMAL LOW (ref >60)
GFR calc non Af Amer: 43 mL/min — ABNORMAL LOW (ref >60)
GLUCOSE: 262 mg/dL — AB (ref 70–99)
Potassium: 4 mmol/L (ref 3.5–5.1)
Sodium: 135 mmol/L (ref 135–145)

## 2018-10-04 LAB — CBC
HCT: 31.7 % — ABNORMAL LOW (ref 39.0–52.0)
Hemoglobin: 10.2 g/dL — ABNORMAL LOW (ref 13.0–17.0)
MCH: 30.9 pg (ref 26.0–34.0)
MCHC: 32.2 g/dL (ref 30.0–36.0)
MCV: 96.1 fL (ref 80.0–100.0)
Platelets: 189 10*3/uL (ref 150–400)
RBC: 3.3 MIL/uL — ABNORMAL LOW (ref 4.22–5.81)
RDW: 15.5 % (ref 11.5–15.5)
WBC: 10.7 10*3/uL — ABNORMAL HIGH (ref 4.0–10.5)
nRBC: 0 % (ref 0.0–0.2)

## 2018-10-04 LAB — GLUCOSE, CAPILLARY
Glucose-Capillary: 263 mg/dL — ABNORMAL HIGH (ref 70–99)
Glucose-Capillary: 281 mg/dL — ABNORMAL HIGH (ref 70–99)
Glucose-Capillary: 270 mg/dL — ABNORMAL HIGH (ref 70–99)
Glucose-Capillary: 259 mg/dL — ABNORMAL HIGH (ref 70–99)

## 2018-10-04 MED ORDER — INSULIN NPH (HUMAN) (ISOPHANE) 100 UNIT/ML ~~LOC~~ SUSP
25.0000 [IU] | Freq: Two times a day (BID) | SUBCUTANEOUS | Status: DC
  Administered 2018-10-04 (×2): 25 [IU] via SUBCUTANEOUS
  Filled 2018-10-04: qty 10

## 2018-10-04 MED ORDER — POLYETHYLENE GLYCOL 3350 17 G PO PACK
17.0000 g | PACK | Freq: Two times a day (BID) | ORAL | Status: DC
  Administered 2018-10-04 – 2018-10-05 (×3): 17 g via ORAL
  Filled 2018-10-04 (×3): qty 1

## 2018-10-04 MED ORDER — SENNA 8.6 MG PO TABS
2.0000 | ORAL_TABLET | Freq: Every day | ORAL | Status: DC
  Administered 2018-10-04: 17.2 mg via ORAL
  Filled 2018-10-04: qty 2

## 2018-10-04 MED ORDER — LACTATED RINGERS IV BOLUS
500.0000 mL | Freq: Once | INTRAVENOUS | Status: AC
  Administered 2018-10-04: 500 mL via INTRAVENOUS

## 2018-10-04 MED ORDER — LACTATED RINGERS IV SOLN
INTRAVENOUS | Status: AC
  Administered 2018-10-04 – 2018-10-05 (×3): via INTRAVENOUS

## 2018-10-04 MED ORDER — INSULIN ASPART 100 UNIT/ML ~~LOC~~ SOLN
8.0000 [IU] | Freq: Three times a day (TID) | SUBCUTANEOUS | Status: DC
  Administered 2018-10-04 (×2): 8 [IU] via SUBCUTANEOUS

## 2018-10-04 NOTE — Progress Notes (Signed)
SPORTS MEDICINE AND JOINT REPLACEMENT  Georgena Spurling, MD    Laurier Nancy, PA-C [address] [place], [place], [place]  [address]                             [phone]   PROGRESS NOTE  Subjective:  negative for Chest Pain  negative for Shortness of Breath  negative for Nausea/Vomiting   negative for Calf Pain  negative for Bowel Movement   Tolerating Diet: yes         Patient reports pain as 4 on 0-10 scale.    Objective: Vital signs in last 24 hours:    Patient Vitals for the past 24 hrs:  BP Temp Temp src Pulse Resp SpO2  10/04/18 0434 (!) 115/53 98.6 F (37 C) Oral (!) 101 - 97 %  10/04/18 0133 - 98.4 F (36.9 C) Oral - - 97 %  10/03/18 2116 (!) 99/59 99.3 F (37.4 C) Oral (!) 101 18 93 %  10/03/18 1343 (!) 111/52 98.8 F (37.1 C) Oral 95 18 95 %    @flow {1959:LAST@   Intake/Output from previous day:   01/31 0701 - 02/01 0700 In: 1200 [P.O.:1200] Out: 600 [Urine:600]   Intake/Output this shift:   No intake/output data recorded.   Intake/Output      01/31 0701 - 02/01 0700 02/01 0701 - 02/02 0700   P.O. 1200    I.V. (mL/kg)     IV Piggyback     Total Intake(mL/kg) 1200 (10.6)    Urine (mL/kg/hr) 600 (0.2)    Blood     Total Output 600    Net +600            LABORATORY DATA: Recent Labs    10/02/18 0805 10/03/18 0255 10/04/18 0450  WBC 8.6 12.9* 10.7*  HGB 13.1 11.7* 10.2*  HCT 40.6 36.0* 31.7*  PLT 176 210 189   Recent Labs    10/02/18 0805 10/03/18 0255 10/04/18 0450  NA 139 139 135  K 4.2 4.1 4.0  CL 103 104 99  CO2 24 26 27   BUN 11 11 22   CREATININE 0.89 0.87 1.64*  GLUCOSE 370* 245* 262*  CALCIUM 8.6* 8.3* 8.5*   Lab Results  Component Value Date   INR 1.03 10/02/2018    Examination:  General appearance: alert, cooperative and no distress Extremities: extremities normal, atraumatic, no cyanosis or edema  Wound Exam: clean, dry, intact   Drainage:  None: wound tissue dry  Motor Exam: Quadriceps and Hamstrings  Intact  Sensory Exam: Superficial Peroneal, Deep Peroneal and Tibial normal   Assessment:    2 Days Post-Op  Procedure(s) (LRB): INTRAMEDULLARY (IM) NAIL FEMORAL (Left)  ADDITIONAL DIAGNOSIS:  Principal Problem:   Closed left hip fracture, initial encounter (HCC) Active Problems:   OSA (obstructive sleep apnea)   Diabetes mellitus without complication (HCC)   Adjustment disorder     Plan: Physical Therapy as ordered Weight Bearing as Tolerated (WBAT)  DVT Prophylaxis:  Lovenox  DISCHARGE PLAN: Skilled Nursing Facility/Rehab  Patient doing well, waiting on SNF placement from CSW. Will continue to follow  Guy Sandifer 10/04/2018, 8:41 AM

## 2018-10-04 NOTE — Progress Notes (Addendum)
PROGRESS NOTE    Johnathan Le  GLO:[id number] DOB: 1951/05/20 DOA: 10/02/2018 PCP: [facility]   Brief Narrative:  Johnathan Le is Johnathan Le 68 y.o. male with medical history significant of DM with h/o neuropathy and OSA presenting with [place] fall.  The patient got up about 6 AM and went out to walk his dogs.  He suffered an apparent mechanical fall and fell backwards, landing on his left hip. He dragged himself back up to his porch and his wife found him as she was leaving to go to work.  He is having significant left hip pain.  Assessment & Plan:   Principal Problem:   Closed left hip fracture, initial encounter (HCC) Active Problems:   OSA (obstructive sleep apnea)   Diabetes mellitus without complication (HCC)   Adjustment disorder  Addendum: BP's soft in the 90's systolic this afternoon.  No BP meds, held today.  Normal HR in 80's, 90's.  Afebrile.  Will give gentle bolus and follow up BP.  Follow up CXR with his hypoxia, nursing unable to wean him today.  Will continue to monitor closely.  Some confusion on awakening per nursing, better now, still Shajuana Mclucas bit disoriented on my evaluation, but no focal deficits appreciated.  Apparently he was confused last night as well.  Suspect delirium related to hospitalization.  Stop gabapentin.   Left hip fracture -Mechanical fall resulting in hip fracture -s/p IM nail on 1/30 -lovenox for DVT ppx x 6 weeks per ortho -schedule APAP, oxy prn, dilaudid prn -bowel regimen  -PT recommending SNF placement, will discuss with social work Per Ortho Weightbearing: WBAT LLE  Insicional and dressing care: OK to remove dressings 7 days postop, leave steri strips and leave open to air with dry gauze PRN Orthopedic device(s): none Showering: PRN VTE prophylaxis: Lovenox 40mg  qd x6 weeks, if ambulating well will transition to aspirin in clinic Pain control: PRN meds, minimize narcs Follow - up plan: 2 weeks with XR in clinic  Recurrent Falls Reports this is  2nd fall within about [place] week, didn't trip on anything, just fell backwards Denies focal weakness, LH, or vertiginous symptoms Neuro exam without focal deficits This may be related to his 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 Head CT with mild to moderate parenchymal brain volume loss, advanced for age and mild chronic small vessel ischemic changes PT/OT recommending SNF  Acute Kidney Injury: related to acei/NSAID? Will follow bladder scan, UA, and start IVF. Continue to monitor Hold celebrex/lisinopril Consider renal US if not improving  O2 Requirement: Wearing O2, but not clear true need Wean O2 as tolerated W/u additionally as needed  DM -A1c 8.0 -hold Glucophage, jardiance  -NPH 25 BID with 8 units TID with meals (of note, he says he uses 70/30 60 units twice daily - will confirm with pharmacy) -Cover with moderate-scale SSI  OSA -Not on CPAP -He may benefit from outpatient resumption -Will order as inpt  Adjustment disorder -He is struggling.  -Due to his neuropathy, he had to stop riding his motorcycle.  He had done this for 50 years and misses it dearly.   -He also was forced to retire from his job.   -He is finding it difficult to cope with these life changes. -He may benefit from outpatient psych support but is not currently suicidal.  DVT prophylaxis: lovenox Code Status: full  Family Communication: none at bedside Disposition Plan: pending SNF placement   Consultants:   orthopedics  Procedures:   IM nail by ortho 1/30  Antimicrobials:  Anti-infectives (From admission, onward)   Start     Dose/Rate Route Frequency Ordered Stop   10/02/18 1900  ceFAZolin (ANCEF) IVPB 2g/100 mL premix     2 g 200 mL/hr over 30 Minutes Intravenous Every 6 hours 10/02/18 1811 10/03/18 0036   10/02/18 1700  ceFAZolin (ANCEF) IVPB 2g/100 mL premix  Status:  Discontinued     2 g 200 mL/hr over 30 Minutes Intravenous Every 6 hours 10/02/18 1657 10/02/18 1811      Subjective: Doing well Discussed CT Asking if we can get O2 off  Objective: Vitals:   10/03/18 1343 10/03/18 2116 10/04/18 0133 10/04/18 0434  BP: (!) 111/52 (!) 99/59  (!) 115/53  Pulse: 95 (!) 101  (!) 101  Resp: 18 18    Temp: 98.8 F (37.1 C) 99.3 F (37.4 C) 98.4 F (36.9 C) 98.6 F (37 C)  TempSrc: Oral Oral Oral Oral  SpO2: 95% 93% 97% 97%  Weight:      Height:        Intake/Output Summary (Last 24 hours) at 10/04/2018 0855 Last data filed at 10/04/2018 0700 Gross per 24 hour  Intake 1260 ml  Output 600 ml  Net 660 ml   Filed Weights   10/02/18 0801  Weight: 113.4 kg    Examination:  General: No acute distress. Cardiovascular: Heart sounds show Shontia Gillooly regular rate, and rhythm.  Lungs: Clear to auscultation bilaterally  Abdomen: Soft, nontender, nondistended Neurological: Alert and oriented 3. Moves all extremities 4. Cranial nerves II through XII grossly intact. Skin: Warm and dry. No rashes or lesions. Extremities: No clubbing or cyanosis. No edema.  Psychiatric: Mood and affect are normal. Insight and judgment are appropriate.   Data Reviewed: I have personally reviewed following labs and imaging studies  CBC: Recent Labs  Lab 10/02/18 0805 10/03/18 0255 10/04/18 0450  WBC 8.6 12.9* 10.7*  NEUTROABS 6.5  --   --   HGB 13.1 11.7* 10.2*  HCT 40.6 36.0* 31.7*  MCV 94.9 96.0 96.1  PLT 176 210 189   Basic Metabolic Panel: Recent Labs  Lab 10/02/18 0805 10/03/18 0255 10/04/18 0450  NA 139 139 135  K 4.2 4.1 4.0  CL 103 104 99  CO2 24 26 27   GLUCOSE 370* 245* 262*  BUN 11 11 22   CREATININE 0.89 0.87 1.64*  CALCIUM 8.6* 8.3* 8.5*   GFR: Estimated Creatinine Clearance: 57.5 mL/min (Carline Dura) (by C-G formula based on SCr of 1.64 mg/dL (H)). Liver Function Tests: Recent Labs  Lab 10/02/18 0805  AST 17  ALT 25  ALKPHOS 67  BILITOT 0.9  PROT 5.7*  ALBUMIN 3.5   No results for input(s): LIPASE, AMYLASE in the last 168 hours. No results for  input(s): AMMONIA in the last 168 hours. Coagulation Profile: Recent Labs  Lab 10/02/18 0805  INR 1.03   Cardiac Enzymes: No results for input(s): CKTOTAL, CKMB, CKMBINDEX, TROPONINI in the last 168 hours. BNP (last 3 results) No results for input(s): PROBNP in the last 8760 hours. HbA1C: Recent Labs    10/02/18 0805  HGBA1C 8.0*   CBG: Recent Labs  Lab 10/03/18 0604 10/03/18 1121 10/03/18 1632 10/03/18 2122 10/04/18 0558  GLUCAP 251* 347* 324* 311* 263*   Lipid Profile: No results for input(s): CHOL, HDL, LDLCALC, TRIG, CHOLHDL, LDLDIRECT in the last 72 hours. Thyroid Function Tests: No results for input(s): TSH, T4TOTAL, FREET4, T3FREE, THYROIDAB in the last 72 hours. Anemia Panel: No results for input(s): VITAMINB12, FOLATE, FERRITIN,  TIBC, IRON, RETICCTPCT in the last 72 hours. Sepsis Labs: No results for input(s): PROCALCITON, LATICACIDVEN in the last 168 hours.  No results found for this or any previous visit (from the past 240 hour(s)).       Radiology Studies: Ct Head Wo Contrast  Result Date: 10/03/2018 CLINICAL DATA:  Larey Seat, fractured LEFT hip, status post surgery yesterday. History of diabetes. EXAM: CT HEAD WITHOUT CONTRAST TECHNIQUE: Contiguous axial images were obtained from the base of the skull through the vertex without intravenous contrast. COMPARISON:  None. FINDINGS: BRAIN: No intraparenchymal hemorrhage, mass effect nor midline shift. Mild-to-moderate parenchymal brain volume loss for age. No hydrocephalus. Patchy supratentorial white matter hypodensities. No acute large vascular territory infarcts. No abnormal extra-axial fluid collections. Basal cisterns are patent. VASCULAR: Mild calcific atherosclerosis of the carotid siphons. SKULL: No skull fracture. No significant scalp soft tissue swelling. SINUSES/ORBITS: Trace paranasal sinus mucosal thickening. Mastoid air cells are well aerated.The included ocular globes and orbital contents are  non-suspicious. Status post bilateral ocular lens implants. OTHER: None. IMPRESSION: 1. No acute intracranial process. 2. Mild-to-moderate parenchymal brain volume loss, advanced for age. 3. Mild chronic small vessel ischemic changes. Electronically Signed   By: Awilda Metro M.D.   On: 10/03/2018 13:35   Dg Chest Portable 1 View  Result Date: 10/02/2018 CLINICAL DATA:  Acute intratrochanteric left hip fracture. EXAM: PORTABLE CHEST 1 VIEW COMPARISON:  01/23/2001 FINDINGS: Top-normal heart size. Normal mediastinal contours. There is no evidence of pulmonary edema, consolidation, pneumothorax, nodule or pleural fluid. The visualized skeletal structures are unremarkable. IMPRESSION: No active disease. Electronically Signed   By: Irish Lack M.D.   On: 10/02/2018 10:29   Dg C-arm 1-60 Min  Result Date: 10/02/2018 CLINICAL DATA:  Intraoperative localization for ORIF of intertrochanteric left hip fracture. EXAM: LEFT FEMUR 2 VIEWS; DG C-ARM 61-120 MIN COMPARISON:  Prior radiograph from earlier the same day. FINDINGS: Multiple spot intraoperative fluoroscopic views for ORIF of the comminuted displaced intertrochanteric left hip fracture seen. Assessment of IM nail with proximal vessel interlocking screws. Hardware well position. No adverse features. Fluoroscopy time equals 1 minutes and 41 seconds. IMPRESSION: Intraoperative fluoroscopic localization for ORIF of comminuted intertrochanteric left hip fracture. Electronically Signed   By: Rise Mu M.D.   On: 10/02/2018 15:07   Dg Hip Port Unilat With Pelvis 1v Left  Result Date: 10/02/2018 CLINICAL DATA:  Initial evaluation status post ORIF of left hip fracture. EXAM: DG HIP (WITH OR WITHOUT PELVIS) 1V PORT LEFT COMPARISON:  Prior radiograph from earlier the same day. FINDINGS: Sequelae of interval ORIF of previously identified comminuted displaced intertrochanteric fracture of the left hip. Placement of IM nail with proximal and distal  interlocking screws. Hardware well positioned. Intertrochanteric fracture in near anatomic alignment. No adverse features. IMPRESSION: Sequelae of interval ORIF for comminuted intratrochanteric fracture of the left hip. No adverse features. Electronically Signed   By: Rise Mu M.D.   On: 10/02/2018 15:05   Dg Hip Unilat With Pelvis 2-3 Views Left  Result Date: 10/02/2018 CLINICAL DATA:  Fall with left hip deformity.  Initial encounter. EXAM: DG HIP (WITH OR WITHOUT PELVIS) 2-3V LEFT COMPARISON:  None. FINDINGS: Acute comminuted intratrochanteric fracture of the proximal left femur noted with displacement. The femoral head shows normal alignment without dislocation. The rest of the bony pelvis is intact without fracture or diastasis. 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 No bony lesions. IMPRESSION: Comminuted and displaced intertrochanteric fracture of the left hip. Electronically Signed  By: Irish Lack M.D.   On: 10/02/2018 09:25   Dg Femur Min 2 Views Left  Result Date: 10/02/2018 CLINICAL DATA:  Intraoperative localization for ORIF of intertrochanteric left hip fracture. EXAM: LEFT FEMUR 2 VIEWS; DG C-ARM 61-120 MIN COMPARISON:  Prior radiograph from earlier the same day. FINDINGS: Multiple spot intraoperative fluoroscopic views for ORIF of the comminuted displaced intertrochanteric left hip fracture seen. Assessment of IM nail with proximal vessel interlocking screws. Hardware well position. No adverse features. Fluoroscopy time equals 1 minutes and 41 seconds. IMPRESSION: Intraoperative fluoroscopic localization for ORIF of comminuted intertrochanteric left hip fracture. Electronically Signed   By: Rise Mu M.D.   On: 10/02/2018 15:07        Scheduled Meds: . acetaminophen  1,000 mg Oral Q8H  . aspirin  81 mg Oral Daily  . docusate sodium  100 mg Oral BID  . enoxaparin (LOVENOX) injection  40 mg Subcutaneous Q24H  . gabapentin  800  mg Oral TID  . insulin aspart  0-15 Units Subcutaneous TID WC  . insulin aspart  0-5 Units Subcutaneous QHS  . insulin aspart  6 Units Subcutaneous TID WC  . insulin NPH Human  25 Units Subcutaneous BID AC & HS   Continuous Infusions: . lactated ringers Stopped (10/02/18 1842)  . lactated ringers    . methocarbamol (ROBAXIN) IV       LOS: 2 days    Time spent: over 30 min    Lacretia Nicks, MD [facility] Pager AMION  If 7PM-7AM, please contact night-coverage [email or web address] Password [facility] 10/04/2018, 8:55 AM

## 2018-10-04 NOTE — Progress Notes (Signed)
Checked Pt at 16:25, Pt woke up, confused, oriented to self, disoriented to place, situation and time, O2 sat at room air 82%. Reoriented Pt, encouraged deep breathing, coughing and IS, placed on O2 at 2L, vital signs taken and recorded, O2 sat at 97% .Pt denies pain, no facial droop, face symmetrical, able to raise bilateral arms. MD notified, new orders received and carried out. Rechecked Pt at 18:40, alert and oriented x 4 but forgetful, Pt refused repositioning and stated that he was fine. Will continue to monitor, endorsed accordingly.

## 2018-10-04 NOTE — Progress Notes (Signed)
Pt weaned to room air, O2 sat dropped to low 80s, encouraged cough, deep breathing and IS, O2 went up to 95% on room air. Will continue to monitor.

## 2018-10-04 NOTE — Progress Notes (Signed)
Pt has refused cpap again for the third night in a row.  Rt will dc order at this time. Rt will continue to monitor.

## 2018-10-04 NOTE — Progress Notes (Signed)
Patient to [facility].  

## 2018-10-04 NOTE — Plan of Care (Signed)

## 2018-10-04 NOTE — Progress Notes (Signed)
Patient back from [facility] to 5N26.

## 2018-10-05 DIAGNOSIS — S72002A Fracture of unspecified part of neck of left femur, initial encounter for closed fracture: Secondary | ICD-10-CM

## 2018-10-05 LAB — CBC
HCT: 27.5 % — ABNORMAL LOW (ref 39.0–52.0)
Hemoglobin: 9.2 g/dL — ABNORMAL LOW (ref 13.0–17.0)
MCH: 31.6 pg (ref 26.0–34.0)
MCHC: 33.5 g/dL (ref 30.0–36.0)
MCV: 94.5 fL (ref 80.0–100.0)
NRBC: 0 % (ref 0.0–0.2)
Platelets: 179 10*3/uL (ref 150–400)
RBC: 2.91 MIL/uL — ABNORMAL LOW (ref 4.22–5.81)
RDW: 15.3 % (ref 11.5–15.5)
WBC: 7.5 10*3/uL (ref 4.0–10.5)

## 2018-10-05 LAB — MAGNESIUM: Magnesium: 2.1 mg/dL (ref 1.7–2.4)

## 2018-10-05 LAB — COMPREHENSIVE METABOLIC PANEL
ALT: 16 U/L (ref 0–44)
AST: 17 U/L (ref 15–41)
Albumin: 2.8 g/dL — ABNORMAL LOW (ref 3.5–5.0)
Alkaline Phosphatase: 51 U/L (ref 38–126)
Anion gap: 8 (ref 5–15)
BUN: 26 mg/dL — AB (ref 8–23)
CO2: 28 mmol/L (ref 22–32)
Calcium: 8.3 mg/dL — ABNORMAL LOW (ref 8.9–10.3)
Chloride: 100 mmol/L (ref 98–111)
Creatinine, Ser: 1.03 mg/dL (ref 0.61–1.24)
GFR calc Af Amer: >60 mL/min (ref >60)
GFR calc non Af Amer: >60 mL/min (ref >60)
GLUCOSE: 222 mg/dL — AB (ref 70–99)
Potassium: 3.8 mmol/L (ref 3.5–5.1)
Sodium: 136 mmol/L (ref 135–145)
Total Bilirubin: 0.7 mg/dL (ref 0.3–1.2)
Total Protein: 5.2 g/dL — ABNORMAL LOW (ref 6.5–8.1)

## 2018-10-05 LAB — GLUCOSE, CAPILLARY
Glucose-Capillary: 222 mg/dL — ABNORMAL HIGH (ref 70–99)
Glucose-Capillary: 259 mg/dL — ABNORMAL HIGH (ref 70–99)

## 2018-10-05 MED ORDER — TAMSULOSIN HCL 0.4 MG PO CAPS: 0.4000 mg | ORAL_CAPSULE | Freq: Every day | ORAL | 0 refills | Status: AC

## 2018-10-05 MED ORDER — INSULIN ASPART 100 UNIT/ML ~~LOC~~ SOLN
10.0000 [IU] | Freq: Three times a day (TID) | SUBCUTANEOUS | Status: DC
  Administered 2018-10-05 (×2): 10 [IU] via SUBCUTANEOUS

## 2018-10-05 MED ORDER — INSULIN NPH (HUMAN) (ISOPHANE) 100 UNIT/ML ~~LOC~~ SUSP
28.0000 [IU] | Freq: Two times a day (BID) | SUBCUTANEOUS | Status: DC
  Administered 2018-10-05: 28 [IU] via SUBCUTANEOUS
  Filled 2018-10-05: qty 10

## 2018-10-05 MED ORDER — TAMSULOSIN HCL 0.4 MG PO CAPS
0.4000 mg | ORAL_CAPSULE | Freq: Every day | ORAL | Status: DC
  Administered 2018-10-05: 0.4 mg via ORAL
  Filled 2018-10-05: qty 1

## 2018-10-05 MED ORDER — POLYETHYLENE GLYCOL 3350 17 G PO PACK: 17.0000 g | PACK | Freq: Every day | ORAL | 0 refills | Status: AC

## 2018-10-05 NOTE — Discharge Summary (Signed)
Physician Discharge Summary  Johnathan Le WUJ:[id number] DOB: 11-20-1950 DOA: 10/02/2018  PCP: [facility]  Admit date: 10/02/2018 Discharge date: 10/05/2018  Time spent: 40 minutes  Recommendations for Outpatient Follow-up:  1. Follow up outpatient CBC/CMP 2. Follow up with orthopedics as scheduled outpatient (plan for 2 weeks post op) 3. Follow up with PCP for osteoporosis, consider bisphosphonate 4. Follow up urinary retention.  Started on flomax here.  Consider urology follow up. 5. 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 Follow up with psych/behavioral health regarding mood, suspicion of adjustment disorder regarding his possible adjustment disorder 7. Follow up blood sugars ACHS, resume insulin at discharge   Discharge Diagnoses:  Principal Problem:   Closed left hip fracture, initial encounter (HCC) Active Problems:   OSA (obstructive sleep apnea)   Diabetes mellitus without complication (HCC)   Adjustment disorder   Discharge Condition: stable  Diet recommendation: heart healthy  Filed Weights   10/02/18 0801  Weight: 113.4 kg    History of present illness:  Johnathan Le 68 y.o.malewith medical history significant ofDM with h/o neuropathy and OSA presenting with Stephanine Reas fall.The patient got up about 6 AM and went out to walk his dogs. He suffered an apparent mechanical fall and fell backwards, landing on his left hip. He dragged himself back up to his porch and his wife found him as she was leaving to go to work. He is having significant left hip pain.  He was admitted for Johnathan Le L hip fracture.  Plain films showed comminuted and displaced intertrochanteric fracture of the L hip.  He had IM nail with orthopedics on 1/30.  PT/OT recommending SNF.  His hospital course was complicated by AKI and acute encephalopathy, which have now resolved on the day  of discharge.  Hospital Course:   Left hip fracture -Mechanical fall resulting in hip fracture -s/p IM nail on 1/30 -lovenox for DVT ppx x 6 weeks per ortho (may transition to ASA in clinic) -schedule APAP, oxy prn at discharge -bowel regimen  -Discharge to [facility] today Per Ortho Weightbearing:WBAT LLE Insicional and dressing care: OK to remove dressings 7 days postop, leave steri strips and leave open to air with dry gauze PRN Orthopedic device(s):none Showering: PRN VTE prophylaxis:Lovenox 40mg  qdx6 weeks, if ambulating well will transition to aspirin in clinic Pain control: PRN meds, minimize narcs Follow - up plan: 2 weeks with XR in clinic  Recurrent Falls Reports this is 2nd fall within about Johnathan Le week, didn't trip on anything, just fell backwards Denies focal weakness, LH, or vertiginous symptoms Neuro exam without focal deficits This may be related to his 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 Head CT with mild to moderate parenchymal brain volume loss, advanced for age and mild chronic small vessel ischemic changes PT/OT recommending SNF  Acute Kidney Injury: related to acei/NSAID or retention with urinary retention as noted below.  UA was bland.  It's improved with IVF and holding ACE/nsaid.  Renal US without hydro. Ok for d/c with improvement Will resume lisinopril at discharge     O2 Requirement: Resolved CXR negative  Hypotension: mild, has now resolved, unclear etiology, continue to monitor on antihypertensives below  Acute Encephalopathy: suspect delirium related to hospitalization, gabapentin with AKI?  Back to baseline this AM.  Will resume gabapentin with improvement in renal function.  CTM outpatient.  DM -A1c8.0 -resume homeGlucophage, jardiance  -resume home 70/30 60 units  twice daily  OSA -Not on CPAP -He may benefit from outpatient resumption -Will order as inpt  Adjustment disorder -Due to his neuropathy, he had to stop riding his  motorcycle. He had done this for 50 years and misses it dearly.  -He also was forced to retire from his job. -He is finding it difficult to cope with these life changes. -He may benefit from outpatient psych support   Procedures:  none  Consultations:  orthopedics  Discharge Exam: Vitals:   10/05/18 0621 10/05/18 0801  BP: 133/73 137/82  Pulse: 96 92  Resp:  17  Temp: 98.3 F (36.8 C) 97.7 F (36.5 C)  SpO2: 95% 91%   [name]&Ox3 No complaints today Discuss discharge and meds Wife at bedside.  Concerned about recent falls.  Discussed head CT and normal exam.  Pt denies sx of vertigo or focal weakness.  Discussed possibility of MRI.  Discussed that I don't think one necessary at this time given normal exam and lack of any focal complaints related to fall, but could consider this outpatient.  Expressed understanding.    General: No acute distress. Cardiovascular: Heart sounds show Jyren Cerasoli regular rate, and rhythm.  Lungs: Clear to auscultation bilaterally  Abdomen: Soft, nontender, nondistended Neurological: Alert and oriented 3. Moves all extremities 4. Cranial nerves II through XII intact.  FNF normal. Skin: Warm and dry. No rashes or lesions. Extremities: L hip with intact dressing Psychiatric: Mood and affect are normal. Insight and judgment are appropriate.  Discharge Instructions   Discharge Instructions    Call MD for:  difficulty breathing, headache or visual disturbances   Complete by:  As directed    Call MD for:  extreme fatigue   Complete by:  As directed    Call MD for:  hives   Complete by:  As directed    Call MD for:  persistant dizziness or light-headedness   Complete by:  As directed    Call MD for:  persistant nausea and vomiting   Complete by:  As directed    Call MD for:  redness, tenderness, or signs of infection (pain, swelling, redness, odor or green/yellow discharge around incision site)   Complete by:  As directed    Call MD for:  severe  uncontrolled pain   Complete by:  As directed    Call MD for:  temperature >100.4   Complete by:  As directed    Diet - low sodium heart healthy   Complete by:  As directed    Discharge instructions   Complete by:  As directed    You were seen for Chip Canepa hip fracture.   This was repaired surgically by orthopedics.  Please follow up with your PCP and discuss osteoporosis treatment in follow up.  Continue your vitamin D and eat [place] healthy diet with adequate calcium (1200 mg daily).  We've placed you on tylenol for pain.  You can use oxycodone as well as needed for pain.  You had some urinary retention while you were here that has improved at discharge.  We've started you on flomax.  Please follow up with your PCP after discharge and consider seeing Richy Spradley urologist as an outpatient.    Your head CT did not show any acute findings.  If you continue to have problems with your gait (walking), consider outpatient follow up with neurology.  Return for new, recurrent, or worsening symptoms.  Please ask your PCP to request records from this hospitalization so they know what was  done and what the next steps will be.   Increase activity slowly   Complete by:  As directed      Allergies as of 10/05/2018      Reactions   Pregabalin Nausea And Vomiting      Medication List    TAKE these medications   acetaminophen 500 MG tablet Commonly known as:  TYLENOL Take 2 tablets (1,000 mg total) by mouth every 8 (eight) hours for 14 days.   Alpha-Lipoic Acid 300 MG Caps Take 300 mg by mouth 2 (two) times daily.   aspirin 81 MG tablet Take 81 mg by mouth daily.   Cholecalciferol 25 MCG (1000 UT) capsule Take 1,000 Units by mouth daily.   Co-Enzyme Q-10 100 MG Caps Take 100 mg by mouth daily.   empagliflozin 10 MG Tabs tablet Commonly known as:  JARDIANCE Take 5 mg by mouth daily.   enoxaparin 40 MG/0.4ML injection Commonly known as:  LOVENOX Inject 0.4 mLs (40 mg total) into the skin daily.    Fish Oil 1000 MG Caps Take 1,000 mg by mouth daily.   gabapentin 800 MG tablet Commonly known as:  NEURONTIN Take 800 mg by mouth 3 (three) times daily.   insulin aspart protamine- aspart (70-30) 100 UNIT/ML injection Commonly known as:  NOVOLOG MIX 70/30 Inject 60 Units into the skin 2 (two) times daily with Orpah Hausner meal.   lisinopril 40 MG tablet Commonly known as:  PRINIVIL,ZESTRIL Take 40 mg by mouth daily.   metFORMIN 1000 MG tablet Commonly known as:  GLUCOPHAGE Take 1,000 mg by mouth 2 (two) times daily with Iram Lundberg meal.   multivitamin capsule Take 1 capsule by mouth daily.   oxyCODONE 5 MG immediate release tablet Commonly known as:  Oxy IR/ROXICODONE Take 1 pills every 4-6 hrs as needed for pain   polyethylene glycol packet Commonly known as:  MIRALAX / GLYCOLAX Take 17 g by mouth daily.   tamsulosin 0.4 MG Caps capsule Commonly known as:  FLOMAX Take 1 capsule (0.4 mg total) by mouth daily for 30 days.   vitamin B-12 500 MCG tablet Commonly known as:  CYANOCOBALAMIN Take 500 mcg by mouth daily.      Allergies  Allergen Reactions  . Pregabalin Nausea And Vomiting      The results of significant diagnostics from this hospitalization (including imaging, microbiology, ancillary and laboratory) are listed below for reference.    Significant Diagnostic Studies: Dg Chest 2 View  Result Date: 10/04/2018 CLINICAL DATA:  Hypoxia today. EXAM: CHEST - 2 VIEW COMPARISON:  10/02/2018 FINDINGS: Cardiac silhouette top-normal in size.  Mediastinal or hilar masses. Prominent bronchovascular markings. Linear opacities are noted in the left lung base consistent with atelectasis. No convincing pneumonia or overt pulmonary edema. No pneumothorax. Skeletal structures are grossly intact. IMPRESSION: No acute cardiopulmonary disease. Electronically Signed   By: Amie Portland M.D.   On: 10/04/2018 19:57   Ct Head Wo Contrast  Result Date: 10/03/2018 CLINICAL DATA:  Larey Seat, fractured LEFT  hip, status post surgery yesterday. History of diabetes. EXAM: CT HEAD WITHOUT CONTRAST TECHNIQUE: Contiguous axial images were obtained from the base of the skull through the vertex without intravenous contrast. COMPARISON:  None. FINDINGS: BRAIN: No intraparenchymal hemorrhage, mass effect nor midline shift. Mild-to-moderate parenchymal brain volume loss for age. No hydrocephalus. Patchy supratentorial white matter hypodensities. No acute large vascular territory infarcts. No abnormal extra-axial fluid collections. Basal cisterns are patent. VASCULAR: Mild calcific atherosclerosis of the carotid siphons. SKULL: No skull fracture. No  significant scalp soft tissue swelling. SINUSES/ORBITS: Trace paranasal sinus mucosal thickening. Mastoid air cells are well aerated.The included ocular globes and orbital contents are non-suspicious. Status post bilateral ocular lens implants. OTHER: None. IMPRESSION: 1. No acute intracranial process. 2. Mild-to-moderate parenchymal brain volume loss, advanced for age. 3. Mild chronic small vessel ischemic changes. Electronically Signed   By: Awilda Metro M.D.   On: 10/03/2018 13:35   US Renal  Result Date: 10/04/2018 CLINICAL DATA:  Acute kidney injury EXAM: RENAL / URINARY TRACT ULTRASOUND COMPLETE COMPARISON:  None. FINDINGS: Right Kidney: Renal measurements: 12.6 x 5.2 x 6.9 cm = volume: 232 mL . Echogenicity within normal limits. No mass or hydronephrosis visualized. Non cortical thinning. Left Kidney: Renal measurements: 12.5 x 6.2 x 5.9 cm = volume: 236 mL. Echogenicity within normal limits. No mass or hydronephrosis visualized. Mild cortical thinning. Bladder: Appears normal for degree of bladder distention. IMPRESSION: 1. Mild cortical thinning bilaterally.  No hydronephrosis. Electronically Signed   By: Kennith Center M.D.   On: 10/04/2018 20:03   Dg Chest Portable 1 View  Result Date: 10/02/2018 CLINICAL DATA:  Acute intratrochanteric left hip fracture. EXAM:  PORTABLE CHEST 1 VIEW COMPARISON:  01/23/2001 FINDINGS: Top-normal heart size. Normal mediastinal contours. There is no evidence of pulmonary edema, consolidation, pneumothorax, nodule or pleural fluid. The visualized skeletal structures are unremarkable. IMPRESSION: No active disease. Electronically Signed   By: Irish Lack M.D.   On: 10/02/2018 10:29   Dg C-arm 1-60 Min  Result Date: 10/02/2018 CLINICAL DATA:  Intraoperative localization for ORIF of intertrochanteric left hip fracture. EXAM: LEFT FEMUR 2 VIEWS; DG C-ARM 61-120 MIN COMPARISON:  Prior radiograph from earlier the same day. FINDINGS: Multiple spot intraoperative fluoroscopic views for ORIF of the comminuted displaced intertrochanteric left hip fracture seen. Assessment of IM nail with proximal vessel interlocking screws. Hardware well position. No adverse features. Fluoroscopy time equals 1 minutes and 41 seconds. IMPRESSION: Intraoperative fluoroscopic localization for ORIF of comminuted intertrochanteric left hip fracture. Electronically Signed   By: Rise Mu M.D.   On: 10/02/2018 15:07   Dg Hip Port Unilat With Pelvis 1v Left  Result Date: 10/02/2018 CLINICAL DATA:  Initial evaluation status post ORIF of left hip fracture. EXAM: DG HIP (WITH OR WITHOUT PELVIS) 1V PORT LEFT COMPARISON:  Prior radiograph from earlier the same day. FINDINGS: Sequelae of interval ORIF of previously identified comminuted displaced intertrochanteric fracture of the left hip. Placement of IM nail with proximal and distal interlocking screws. Hardware well positioned. Intertrochanteric fracture in near anatomic alignment. No adverse features. IMPRESSION: Sequelae of interval ORIF for comminuted intratrochanteric fracture of the left hip. No adverse features. Electronically Signed   By: Rise Mu M.D.   On: 10/02/2018 15:05   Dg Hip Unilat With Pelvis 2-3 Views Left  Result Date: 10/02/2018 CLINICAL DATA:  Fall with left hip deformity.   Initial encounter. EXAM: DG HIP (WITH OR WITHOUT PELVIS) 2-3V LEFT COMPARISON:  None. FINDINGS: Acute comminuted intratrochanteric fracture of the proximal left femur noted with displacement. The femoral head shows normal alignment without dislocation. The rest of the bony pelvis is intact without fracture or diastasis. 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 No bony lesions. IMPRESSION: Comminuted and displaced intertrochanteric fracture of the left hip. Electronically Signed   By: Irish Lack M.D.   On: 10/02/2018 09:25   Dg Femur Min 2 Views Left  Result Date: 10/02/2018 CLINICAL DATA:  Intraoperative localization for ORIF of intertrochanteric left hip fracture.  EXAM: LEFT FEMUR 2 VIEWS; DG C-ARM 61-120 MIN COMPARISON:  Prior radiograph from earlier the same day. FINDINGS: Multiple spot intraoperative fluoroscopic views for ORIF of the comminuted displaced intertrochanteric left hip fracture seen. Assessment of IM nail with proximal vessel interlocking screws. Hardware well position. No adverse features. Fluoroscopy time equals 1 minutes and 41 seconds. IMPRESSION: Intraoperative fluoroscopic localization for ORIF of comminuted intertrochanteric left hip fracture. Electronically Signed   By: Rise MuBenjamin  McClintock M.D.   On: 10/02/2018 15:07    Microbiology: No results found for this or any previous visit (from the past 240 hour(s)).   Labs: Basic Metabolic Panel: Recent Labs  Lab 10/02/18 0805 10/03/18 0255 10/04/18 0450 10/05/18 0317  NA 139 139 135 136  K 4.2 4.1 4.0 3.8  CL 103 104 99 100  CO2 24 26 27 28   GLUCOSE 370* 245* 262* 222*  BUN 11 11 22  26*  CREATININE 0.89 0.87 1.64* 1.03  CALCIUM 8.6* 8.3* 8.5* 8.3*  MG  --   --   --  2.1   Liver Function Tests: Recent Labs  Lab 10/02/18 0805 10/05/18 0317  AST 17 17  ALT 25 16  ALKPHOS 67 51  BILITOT 0.9 0.7  PROT 5.7* 5.2*  ALBUMIN 3.5 2.8*   No results for input(s): LIPASE, AMYLASE in the  last 168 hours. No results for input(s): AMMONIA in the last 168 hours. CBC: Recent Labs  Lab 10/02/18 0805 10/03/18 0255 10/04/18 0450 10/05/18 0317  WBC 8.6 12.9* 10.7* 7.5  NEUTROABS 6.5  --   --   --   HGB 13.1 11.7* 10.2* 9.2*  HCT 40.6 36.0* 31.7* 27.5*  MCV 94.9 96.0 96.1 94.5  PLT 176 210 189 179   Cardiac Enzymes: No results for input(s): CKTOTAL, CKMB, CKMBINDEX, TROPONINI in the last 168 hours. BNP: BNP (last 3 results) No results for input(s): BNP in the last 8760 hours.  ProBNP (last 3 results) No results for input(s): PROBNP in the last 8760 hours.  CBG: Recent Labs  Lab 10/04/18 0558 10/04/18 1130 10/04/18 1621 10/04/18 2137 10/05/18 0624  GLUCAP 263* 281* 270* 259* 222*       Signed:  Lacretia Nicksaldwell Powell MD.  [facility] 10/05/2018, 10:52 AM

## 2018-10-05 NOTE — Plan of Care (Signed)

## 2018-10-05 NOTE — Progress Notes (Signed)
Patient is on the list for the ordered flutter valve as per Respiratory.

## 2018-10-05 NOTE — Progress Notes (Signed)
SPORTS MEDICINE AND JOINT REPLACEMENT  Georgena Spurling, MD    Laurier Nancy, PA-C [address] [place], [place], [place]  [address]                             [phone]   PROGRESS NOTE  Subjective:  negative for Chest Pain  negative for Shortness of Breath  negative for Nausea/Vomiting   negative for Calf Pain  negative for Bowel Movement   Tolerating Diet: yes         Patient reports pain as 3 on 0-10 scale.    Objective: Vital signs in last 24 hours:    Patient Vitals for the past 24 hrs:  BP Temp Temp src Pulse Resp SpO2  10/05/18 0801 137/82 97.7 F (36.5 C) Rectal 92 17 91 %  10/05/18 0621 133/73 98.3 F (36.8 C) Oral 96 - 95 %  10/04/18 2010 (!) 107/49 99.8 F (37.7 C) Oral 92 (!) 21 92 %  10/04/18 1636 (!) 93/50 99 F (37.2 C) Oral 89 16 97 %  10/04/18 1310 (!) 95/51 - - 97 - 93 %  10/04/18 1308 (!) 94/43 98.2 F (36.8 C) Oral 95 - 92 %    @flow {1959:LAST@   Intake/Output from previous day:   02/01 0701 - 02/02 0700 In: 1204.6 [P.O.:540; I.V.:664.6] Out: 3050 [Urine:3050]   Intake/Output this shift:   02/02 0701 - 02/02 1900 In: -  Out: 460 [Urine:460]   Intake/Output      02/01 0701 - 02/02 0700 02/02 0701 - 02/03 0700   P.O. 540    I.V. (mL/kg) 664.6 (5.9)    Total Intake(mL/kg) 1204.6 (10.6)    Urine (mL/kg/hr) 3050 (1.1) 460 (1.7)   Total Output 3050 460   Net -1845.4 -460           LABORATORY DATA: Recent Labs    10/02/18 0805 10/03/18 0255 10/04/18 0450 10/05/18 0317  WBC 8.6 12.9* 10.7* 7.5  HGB 13.1 11.7* 10.2* 9.2*  HCT 40.6 36.0* 31.7* 27.5*  PLT 176 210 189 179   Recent Labs    10/02/18 0805 10/03/18 0255 10/04/18 0450 10/05/18 0317  NA 139 139 135 136  K 4.2 4.1 4.0 3.8  CL 103 104 99 100  CO2 24 26 27 28   BUN 11 11 22  26*  CREATININE 0.89 0.87 1.64* 1.03  GLUCOSE 370* 245* 262* 222*  CALCIUM 8.6* 8.3* 8.5* 8.3*   Lab Results  Component Value Date   INR 1.03 10/02/2018    Examination:  General appearance:  alert, cooperative and no distress Extremities: extremities normal, atraumatic, no cyanosis or edema  Wound Exam: clean, dry, intact   Drainage:  None: wound tissue dry  Motor Exam: Quadriceps and Hamstrings Intact  Sensory Exam: Superficial Peroneal, Deep Peroneal and Tibial normal   Assessment:    3 Days Post-Op  Procedure(s) (LRB): INTRAMEDULLARY (IM) NAIL FEMORAL (Left)  ADDITIONAL DIAGNOSIS:  Principal Problem:   Closed left hip fracture, initial encounter (HCC) Active Problems:   OSA (obstructive sleep apnea)   Diabetes mellitus without complication (HCC)   Adjustment disorder     Plan: Physical Therapy as ordered Weight Bearing as Tolerated (WBAT)  DVT Prophylaxis:  Lovenox  DISCHARGE PLAN: Skilled Nursing Facility/Rehab  Still waiting on SNF placement, doing well from an orthopedic standpoint  Guy Sandifer 10/05/2018, 9:28 AM

## 2018-10-05 NOTE — Clinical Social Work Placement (Addendum)
   Patient has bed at [facility]. Room 1204 P  LCSW confirmed bed with facility.   Patient to transport by PTAR.  Patient can transport at 3  LCSW faxed dc docs to facility.   RN report # [phone]  BKJ  CLINICAL SOCIAL WORK PLACEMENT  NOTE  Date:  10/05/2018  Patient Details  Name: Johnathan Le MRN: [id number] Date of Birth: 09-06-1950  Clinical Social Work is seeking post-discharge placement for this patient at the Skilled  Nursing Facility level of care (*CSW will initial, date and re-position this form in  chart as items are completed):  Yes   Patient/family provided with [facility] Clinical Social Work Department's list of facilities offering this level of care within the geographic area requested by the patient (or if unable, by the patient's family).  Yes   Patient/family informed of their freedom to choose among providers that offer the needed level of care, that participate in Medicare, Medicaid or managed care program needed by the patient, have an available bed and are willing to accept the patient.  Yes   Patient/family informed of [facility]'s ownership interest in [facility] and [facility], as well as of the fact that they are under no obligation to receive care at these facilities.  PASRR submitted to EDS on       PASRR number received on 10/03/18     Existing PASRR number confirmed on       FL2 transmitted to all facilities in geographic area requested by pt/family on 12/02/18     FL2 transmitted to all facilities within larger geographic area on       Patient informed that his/her managed care company has contracts with or will negotiate with certain facilities, including the following:        Yes   Patient/family informed of bed offers received.  Patient chooses bed at [facility]     Physician recommends and patient chooses bed at      Patient to be transferred to [facility] on 10/05/18.  Patient to be transferred to facility by  EMS     Patient family notified on 10/05/18 of transfer.  Name of family member notified:  Family at bedside     PHYSICIAN       Additional Comment:    _______________________________________________ Coralyn Helling, LCSW 10/05/2018, 11:09 AM

## 2018-10-05 NOTE — Progress Notes (Signed)
Patient has not voided on this shift on his own.  Abdomen felt very taut and with pressure he felt the urge to urinate but was unable.  Tried warm compressions and getting patient up to standing position while running water.  He voided a very small amount of urine.  Did bladder scan, highest amount shown was 158 mL.  Patient is getting fluids LR at 125 mL.  Did I/O catheter at 0049, removed 900 mL of urine.  This is his second I/O catheter.  Day shift did one around 11 am on 10/04/2018.  Will continue to monitor.

## 2018-10-05 NOTE — Progress Notes (Signed)
Called facility and gave report to Reuel Boom, all questions and concerns addressed, Pt not in distress, wife at bedside, Pt to discharge to facility with belongings via PTAR.

## 2018-10-05 NOTE — Progress Notes (Signed)
Patient voided on his own in urinal 950 mL.

## 2019-11-26 IMAGING — CT CT HEAD W/O CM
4 series · 16 of 47 positions shown, 18 images · non-contrast
Comparison: None.

CLINICAL DATA: Fell, fractured LEFT hip, status post surgery
yesterday. History of diabetes.

EXAM:
CT HEAD WITHOUT CONTRAST
TECHNIQUE: Contiguous axial images were obtained from the base of the skull
through the vertex without intravenous contrast.

[Series 3: head without · axial · non-contrast · 0.43mm/px · z∈[-141,-26]mm · 7 of 31 slices shown, 9 images]
[im 4/31  brain]
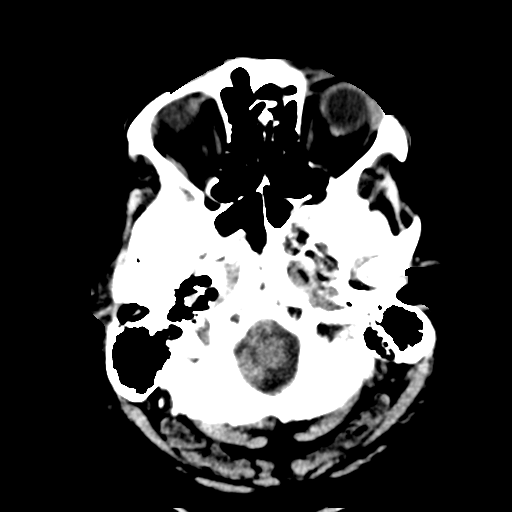
[im 4/31  bone]
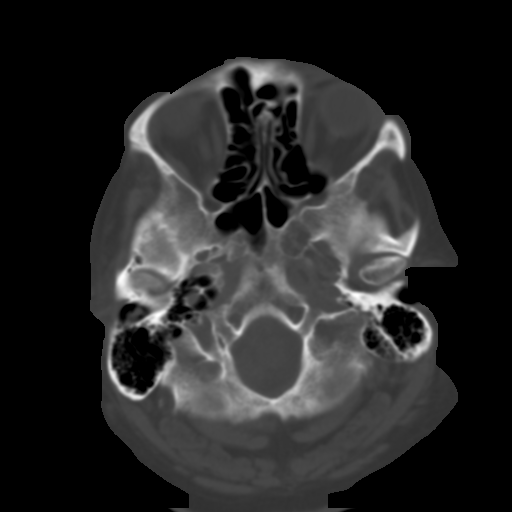
[im 8/31  brain]
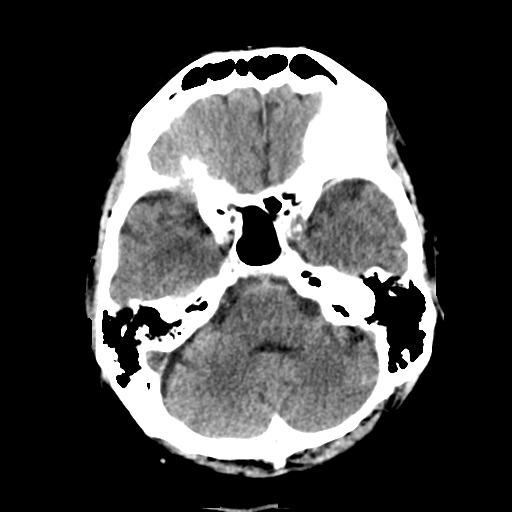
[im 12/31  brain]
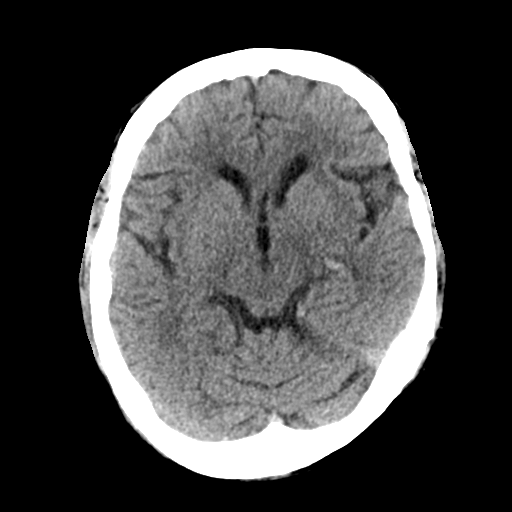
[im 16/31  brain]
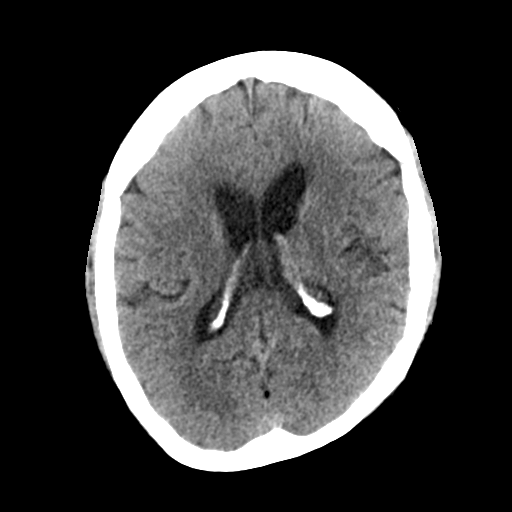
[im 19/31  brain]
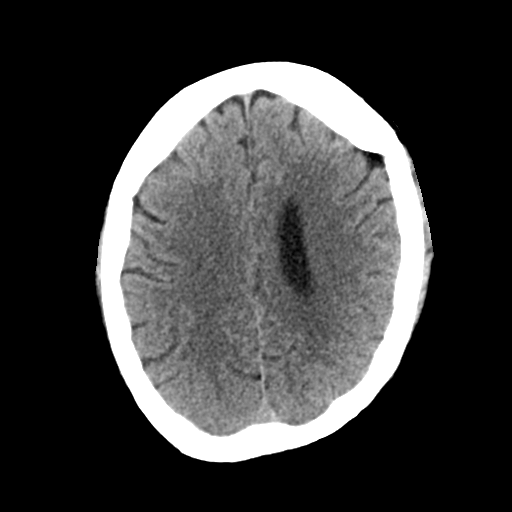
[im 19/31  bone]
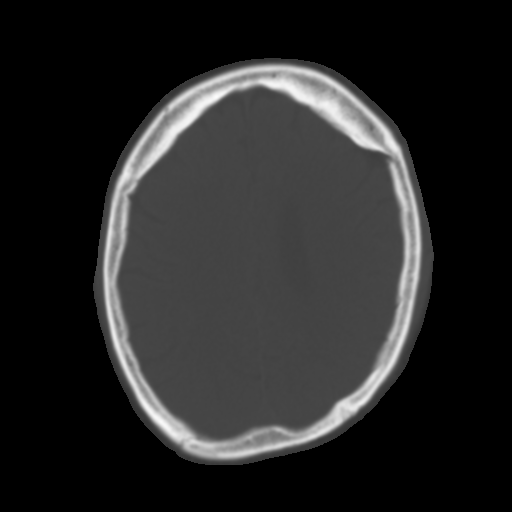
[im 23/31  brain]
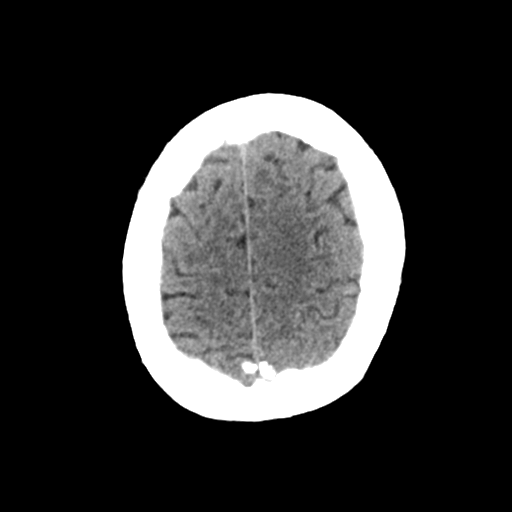
[im 27/31  brain]
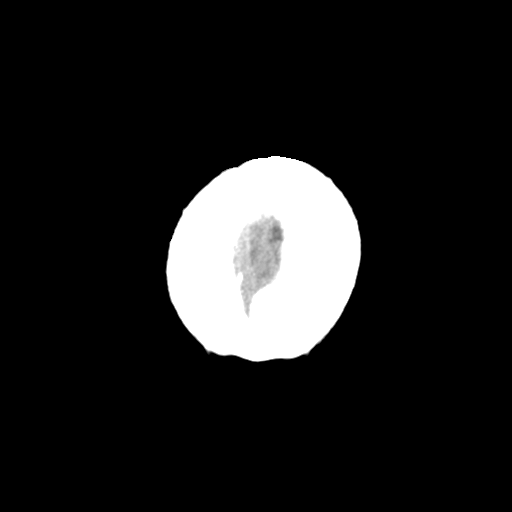

[Series 4: head bone · axial · 0.43mm/px · z∈[-142,-112]mm · 3 of 77 slices shown]
[im 8/77  bone]
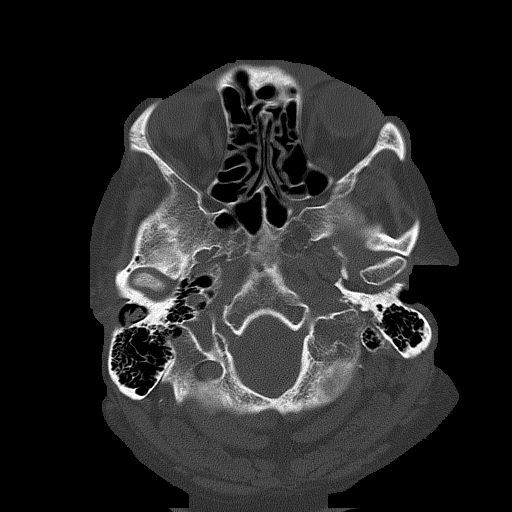
[im 16/77  bone]
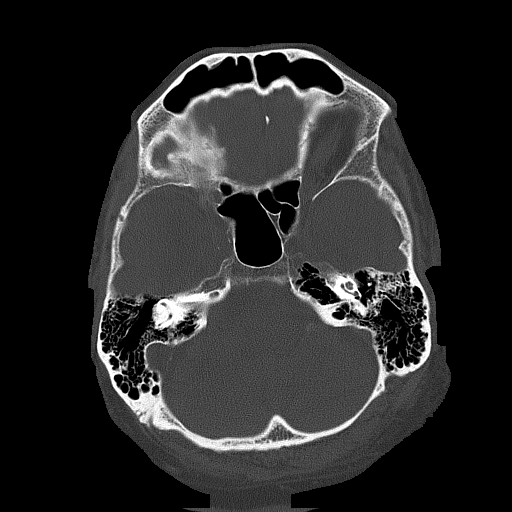
[im 23/77  bone]
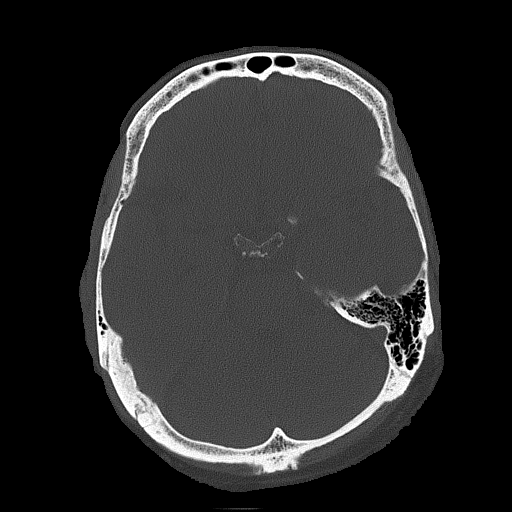

[Series 5: head without cor · coronal · non-contrast · 0.33mm/px · 3 of 69 slices shown]
[im 23/69  brain]
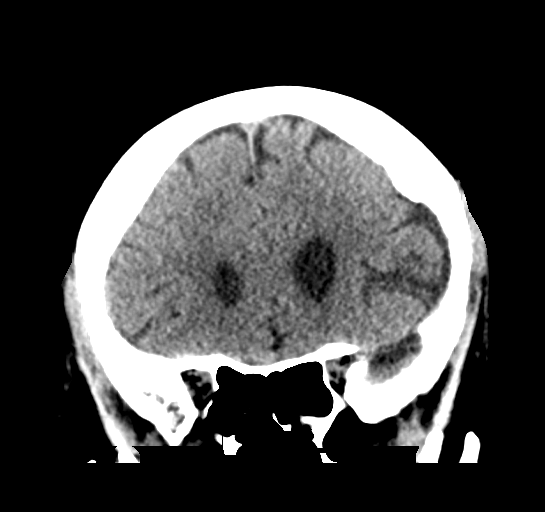
[im 31/69  brain]
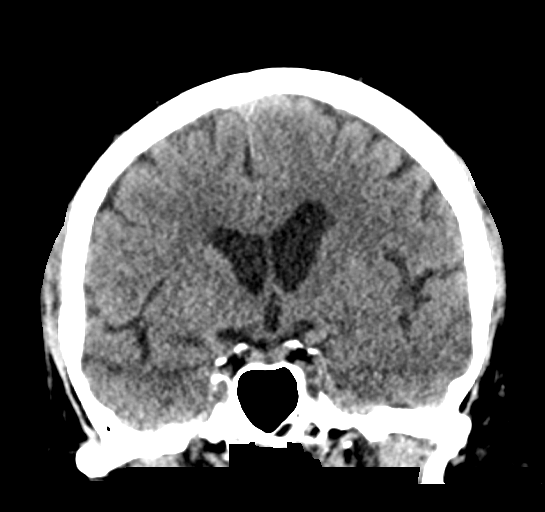
[im 38/69  brain]
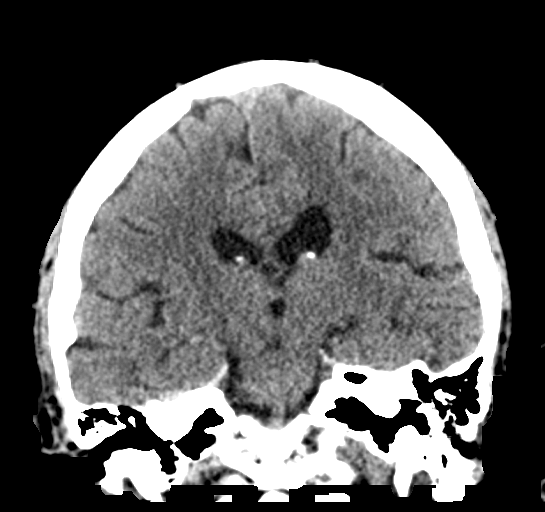

[Series 6: head without sag · sagittal · non-contrast · 0.33mm/px · 3 of 67 slices shown]
[im 23/67  brain]
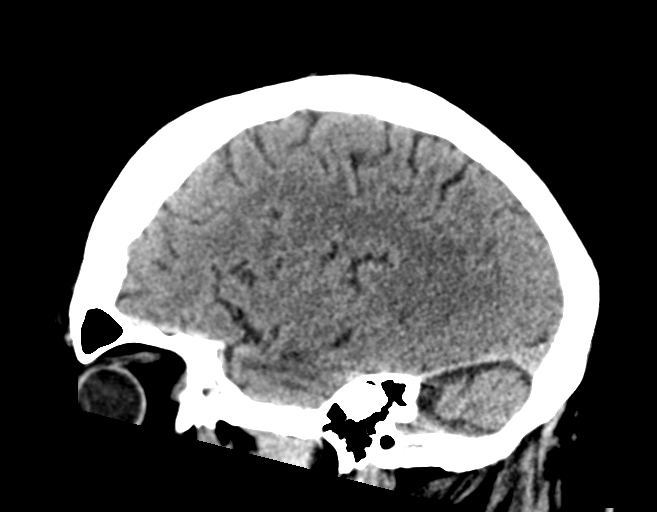
[im 34/67  brain]
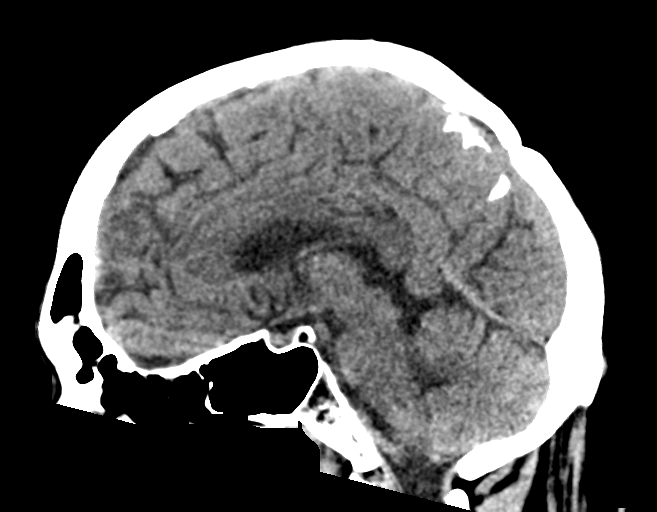
[im 45/67  brain]
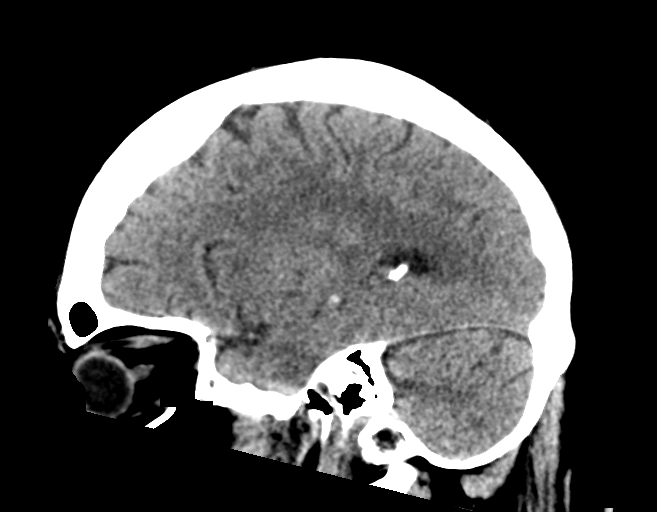

[16 of 47 positions shown; findings below may reference images not displayed]

FINDINGS: BRAIN: No intraparenchymal hemorrhage, mass effect nor midline
shift. Mild-to-moderate parenchymal brain volume loss for age. No
hydrocephalus. Patchy supratentorial white matter hypodensities. No
acute large vascular territory infarcts. No abnormal extra-axial
fluid collections. Basal cisterns are patent.

VASCULAR: Mild calcific atherosclerosis of the carotid siphons.

SKULL: No skull fracture. No significant scalp soft tissue swelling.

SINUSES/ORBITS: Trace paranasal sinus mucosal thickening. Mastoid
air cells are well aerated.The included ocular globes and orbital
contents are non-suspicious. Status post bilateral ocular lens
implants.

OTHER: None.
IMPRESSION: 1. No acute intracranial process.
2. Mild-to-moderate parenchymal brain volume loss, advanced for age.
3. Mild chronic small vessel ischemic changes.

## 2022-09-05 DIAGNOSIS — E871 Hypo-osmolality and hyponatremia: Secondary | ICD-10-CM | POA: Diagnosis not present

## 2022-11-05 DIAGNOSIS — E785 Hyperlipidemia, unspecified: Secondary | ICD-10-CM | POA: Diagnosis not present

## 2022-11-05 DIAGNOSIS — E1142 Type 2 diabetes mellitus with diabetic polyneuropathy: Secondary | ICD-10-CM | POA: Diagnosis not present

## 2022-11-05 DIAGNOSIS — F411 Generalized anxiety disorder: Secondary | ICD-10-CM | POA: Diagnosis not present

## 2022-11-05 DIAGNOSIS — I10 Essential (primary) hypertension: Secondary | ICD-10-CM | POA: Diagnosis not present

## 2022-11-05 DIAGNOSIS — E291 Testicular hypofunction: Secondary | ICD-10-CM | POA: Diagnosis not present

## 2022-11-05 DIAGNOSIS — E559 Vitamin D deficiency, unspecified: Secondary | ICD-10-CM | POA: Diagnosis not present

## 2022-11-05 DIAGNOSIS — M810 Age-related osteoporosis without current pathological fracture: Secondary | ICD-10-CM | POA: Diagnosis not present

## 2022-11-05 DIAGNOSIS — Z0000 Encounter for general adult medical examination without abnormal findings: Secondary | ICD-10-CM | POA: Diagnosis not present

## 2022-11-08 DIAGNOSIS — U071 COVID-19: Secondary | ICD-10-CM | POA: Diagnosis not present

## 2022-12-25 ENCOUNTER — Ambulatory Visit (INDEPENDENT_AMBULATORY_CARE_PROVIDER_SITE_OTHER): Payer: No Typology Code available for payment source | Admitting: Pulmonary Disease

## 2022-12-25 ENCOUNTER — Encounter: Payer: Self-pay | Admitting: Pulmonary Disease

## 2022-12-25 VITALS — BP 128/62 | HR 92 | Ht 71.0 in | Wt 230.0 lb

## 2022-12-25 DIAGNOSIS — J9811 Atelectasis: Secondary | ICD-10-CM | POA: Diagnosis not present

## 2022-12-25 NOTE — Progress Notes (Signed)
Johnathan Le    [id number]    03-16-1951  Primary Care Physician:[facility]  Referring Physician: [facility] [address] [place],  [place] [address]  Chief complaint:   Patient being seen for an abnormal CT scan of the chest  HPI:  Patient stated he was advised about follow up with [place] following for an a CT scan He had had COVID   Had a chest x-ray that was abnormal and subsequently had a CT scan of the chest  CT scan of the chest was reviewed with the patient today showing atelectasis of the lingula.  Symptoms from the COVID has resolved Very minimal cough  Is limited with activities secondary to significant musculoskeletal limitations  He has had some intentional weight loss-on Ozempic  No hemoptysis No chest pains or chest discomfort Not coughing up any secretions  No pertinent occupational history, some exposure to different agents in the past  Quit smoking over 40 years ago  Outpatient Encounter Medications as of 12/25/2022  Medication Sig   Alpha-Lipoic Acid 300 MG CAPS Take 300 mg by mouth 2 (two) times daily.   aspirin 81 MG tablet Take 81 mg by mouth daily.   atorvastatin (LIPITOR) 80 MG tablet one half tablet (40 mg dose).   brimonidine (ALPHAGAN) 0.2 % ophthalmic solution INSTILL 1 DROP IN BOTH EYES TWICE A DAY   cetirizine (ZYRTEC) 10 MG tablet TAKE ONE TABLET BY MOUTH DAILY AS NEEDED FOR ALLERGY   Cholecalciferol 25 MCG (1000 UT) capsule Take 1,000 Units by mouth daily.   Co-Enzyme Q-10 100 MG CAPS Take 100 mg by mouth daily.   diclofenac (VOLTAREN) 75 MG EC tablet TAKE ONE TABLET BY MOUTH TWICE A DAY AS NEEDED FOR PAIN. STOP IBUPROFEN.   empagliflozin (JARDIANCE) 10 MG TABS tablet Take 5 mg by mouth daily.   gabapentin (NEURONTIN) 800 MG tablet Take 800 mg by mouth 3 (three) times daily.   hydrOXYzine (ATARAX) 10 MG tablet TAKE ONE TABLET BY MOUTH THREE TIMES A DAY AS NEEDED FOR ANXIETY, SLEEP   insulin aspart protamine-  aspart (NOVOLOG MIX 70/30) (70-30) 100 UNIT/ML injection Inject 60 Units into the skin 2 (two) times daily with a meal.   lisinopril (PRINIVIL,ZESTRIL) 40 MG tablet Take 40 mg by mouth daily.   metFORMIN (GLUCOPHAGE) 1000 MG tablet Take 1,000 mg by mouth 2 (two) times daily with a meal.   Multiple Vitamin (MULTIVITAMIN) capsule Take 1 capsule by mouth daily.   Omega-3 Fatty Acids (FISH OIL) 1000 MG CAPS Take 1,000 mg by mouth daily.   oxyCODONE (OXY IR/ROXICODONE) 5 MG immediate release tablet Take 1 pills every 4-6 hrs as needed for pain   polyethylene glycol (MIRALAX / GLYCOLAX) packet Take 17 g by mouth daily.   Semaglutide,0.25 or 0.5MG /DOS, 2 MG/3ML SOPN INJECT 0.5MG  SUBCUTANEOUSLY ONCE WEEKLY   tamsulosin (FLOMAX) 0.4 MG CAPS capsule Take 1 capsule by mouth every evening.   Testosterone 1.62 % GEL APPLY 2 PUMPS TO SKIN DAILY (APPLY AFTER SHOWER OR BATH TO CLEAN, DRY, INTACT SKIN ON UPPER ARM OR SHOULDER AREAS ONLY)   vitamin B-12 (CYANOCOBALAMIN) 500 MCG tablet Take 500 mcg by mouth daily.   enoxaparin (LOVENOX) 40 MG/0.4ML injection Inject 0.4 mLs (40 mg total) into the skin daily.   No facility-administered encounter medications on file as of 12/25/2022.    Allergies as of 12/25/2022 - Review Complete 12/25/2022  Allergen Reaction Noted   Pregabalin Nausea And Vomiting 12/21/2011  Past Medical History:  Diagnosis Date   Diabetes mellitus without complication    OSA (obstructive sleep apnea)    not on CPAP    Past Surgical History:  Procedure Laterality Date   FEMUR IM NAIL Left 10/02/2018   Procedure: INTRAMEDULLARY (IM) NAIL FEMORAL;  Surgeon: Bjorn Pippin, MD;  Location: [facility] OR;  Service: Orthopedics;  Laterality: Left;    History reviewed. No pertinent family history.  Social History   Socioeconomic History   Marital status: Married    Spouse name: Not on file   Number of children: Not on file   Years of education: Not on file   Highest education level: Not on  file  Occupational History   Occupation: retired  Tobacco Use   Smoking status: Never   Smokeless tobacco: Never  Substance and Sexual Activity   Alcohol use: Yes    Alcohol/week: 0.0 standard drinks of alcohol    Comment: rare   Drug use: No   Sexual activity: Not Currently  Other Topics Concern   Not on file  Social History Narrative   He is struggling. Due to his neuropathy, he had to stop riding his motorcycle.  He had done this for 50 years and misses it dearly.  He also was forced to retire from his job.  He is finding it difficult to cope with these life changes.   Social Determinants of Health   Financial Resource Strain: Not on file  Food Insecurity: Not on file  Transportation Needs: Not on file  Physical Activity: Not on file  Stress: Not on file  Social Connections: Not on file  Intimate Partner Violence: Not on file    Review of Systems  Respiratory:  Positive for shortness of breath.     Vitals:   12/25/22 0923  BP: 128/62  Pulse: 92  SpO2: 95%     Physical Exam Constitutional:      Appearance: He is obese.  HENT:     Head: Normocephalic.     Mouth/Throat:     Mouth: Mucous membranes are moist.  Eyes:     General: No scleral icterus. Cardiovascular:     Rate and Rhythm: Normal rate and regular rhythm.     Heart sounds: No murmur heard.    No friction rub.  Pulmonary:     Effort: No respiratory distress.     Breath sounds: No stridor. No wheezing or rhonchi.  Musculoskeletal:     Cervical back: No rigidity or tenderness.  Neurological:     Mental Status: He is alert.  Psychiatric:        Mood and Affect: Mood normal.    Data Reviewed: CT scan was reviewed with the patient showing atelectasis on the lingula  Assessment:  Abnormal CT scan of chest showing atelectasis in the lingula  No significant ongoing symptoms  Treated for an infection recently  Reformed smoker  History of obstructive sleep apnea Not on CPAP  therapy Continues to lose weight  Plan/Recommendations: Follow-up CT scan about 2 weeks to allow for about 6 weeks to allow the infiltrative process to resolve  Finding may be related to recent COVID infection  May need to rule out endobronchial process if atelectasis persists  Patient not having significant symptoms at the present time  Encouraged to call with significant concerns   will see for follow-up after repeat CT   Virl Diamond MD [facility] 12/25/2022, 9:47 AM  CC: [facility]

## 2022-12-25 NOTE — Patient Instructions (Addendum)
Repeat CT scan of the chest in about 2 weeks from head will make it about 6 weeks since your last CAT scan  The CAT scan is to follow-up the area of collapse in your lungs,  expectation is for this area to have cleared up since you are feeling much better from the recent COVID infection  If the area is still collapsed then we may need to perform a bronchoscopy to take a look in the area to find out why it is not opening back up  Call with significant concerns  Follow-up in about 4 weeks

## 2023-01-15 ENCOUNTER — Ambulatory Visit (HOSPITAL_COMMUNITY): Payer: Medicare PPO

## 2023-01-22 ENCOUNTER — Ambulatory Visit: Payer: Medicare PPO | Admitting: Pulmonary Disease

## 2023-02-01 ENCOUNTER — Ambulatory Visit (HOSPITAL_COMMUNITY)
Admission: RE | Admit: 2023-02-01 | Discharge: 2023-02-01 | Disposition: A | Discharge status: Home / Self Care | Payer: Medicare PPO | Source: Ambulatory Visit | Attending: Pulmonary Disease | Admitting: Pulmonary Disease

## 2023-02-01 DIAGNOSIS — R918 Other nonspecific abnormal finding of lung field: Secondary | ICD-10-CM | POA: Diagnosis not present

## 2023-02-01 DIAGNOSIS — J9811 Atelectasis: Secondary | ICD-10-CM | POA: Insufficient documentation

## 2023-02-07 ENCOUNTER — Telehealth: Payer: Self-pay | Admitting: Pulmonary Disease

## 2023-02-07 NOTE — Telephone Encounter (Signed)
Called and spoke with Tiffany at [facility]:  Call report.  Sent to Dr. Wynona Neat.   Right ground-glass pulmonary nodule within the upper lobe measuring 10 mm.   3.8 cm hypodense thyroid nodule within the isthmus   Dr. Wynona Neat, please advise.  Thank you.

## 2023-02-07 NOTE — Telephone Encounter (Signed)
Will await contacting patient until Dr. Wynona Neat determines plan (bronch?) and give all results at one time.  Will address after Dr. Wynona Neat responds.

## 2023-02-07 NOTE — Telephone Encounter (Signed)
Called and spoke with Tiffany at [facility]:  Call report.  Sent to Dr. Wynona Neat.     Right ground-glass pulmonary nodule within the upper lobe measuring 10 mm.    3.8 cm hypodense thyroid nodule within the isthmus     Message routed to [facility] NP as Dr. Wynona Neat is not reachable today.  Katie, please advise.  Thank you.

## 2023-02-07 NOTE — Telephone Encounter (Signed)
Call Report  

## 2023-02-11 ENCOUNTER — Ambulatory Visit: Payer: Medicare PPO | Admitting: Primary Care

## 2023-02-11 ENCOUNTER — Telehealth: Payer: Self-pay | Admitting: Nurse Practitioner

## 2023-02-11 ENCOUNTER — Encounter: Payer: Self-pay | Admitting: Nurse Practitioner

## 2023-02-11 ENCOUNTER — Ambulatory Visit (INDEPENDENT_AMBULATORY_CARE_PROVIDER_SITE_OTHER): Payer: No Typology Code available for payment source | Admitting: Nurse Practitioner

## 2023-02-11 VITALS — BP 112/58 | HR 70 | Temp 98.1°F | Ht 71.0 in | Wt 240.0 lb

## 2023-02-11 DIAGNOSIS — J181 Lobar pneumonia, unspecified organism: Secondary | ICD-10-CM

## 2023-02-11 DIAGNOSIS — R911 Solitary pulmonary nodule: Secondary | ICD-10-CM

## 2023-02-11 DIAGNOSIS — E041 Nontoxic single thyroid nodule: Secondary | ICD-10-CM

## 2023-02-11 NOTE — Telephone Encounter (Signed)
Schedule in office visit for follow-up to discuss CT scan findings.  Will need bronchoscopy to evaluate for cause of persistent atelectasis.  Right upper lobe nodule was not present in March CT, will need radiological follow-up, repeat CT in 3 months.  May see APP if no opening on my schedule, sooner than later

## 2023-02-11 NOTE — Telephone Encounter (Signed)
Pt is scheduled with Katie today for appt. Closing encounter.

## 2023-02-11 NOTE — Progress Notes (Signed)
@Patient  ID: Gay Rape, male    DOB: [birth year], 72 y.o.   MRN: [id number]  Chief Complaint  Patient presents with   Follow-up    Ct review,     Referring provider: [facility]  HPI: 72 year old male, former smoker (40 pack year hx) followed for abnormal CT of the chest. He is a patient of Dr. Trena Platt and last seen in office 12/25/2022. Past medical history significant for OSA, DM, HLD, allergic rhinitis, hypertension.  TEST/EVENTS:  02/01/2023 CT chest without contrast: Some calcified right hilar nodes, consistent with prior granulomatous disease.  Is make hypodense nodule measuring 3.8 cm, slightly more prominent than prior.  Lungs are well aerated bilaterally.  Few calcified granulomas.  Stable lingular consolidation with persistent mucous plugging.  New groundglass density in the right upper lobe measuring 10 mm.  Cholelithiasis.  Degenerative thoracic spine.  12/25/2022: OV with Dr. Wynona Neat.  Had COVID recently.  Had a chest x-ray that was abnormal and subsequently had a CT of the chest.  CT of the chest showed atelectasis of the lingula.  Symptoms of COVID have resolved.  Very minimal cough.  Is limited with activities secondary to significant musculoskeletal limitations.  He has had some intentional weight loss with Ozempic.  No hemoptysis no chest pains or chest discomfort.  Not coughing up any secretions.  No pertinent occupational history.  Some exposure to different agents in the past.  Quit smoking over 40 years ago.  Treated for infection recently.  Plan to repeat CT chest in about 2 weeks to allow 6 weeks for resolution.  Possibly related to recent COVID infection.  May need to rule out endobronchial process if atelectasis persists.  02/11/2023: Today-follow-up Patient presents today for follow up to discuss CT imaging which shows a persistent consolidation in the lingula, concerning for possible endobronchial lesion. He also has a new RUL ground glass 1 cm pulmonary  nodule as well as a thyroid nodule. He tells me today that he has been doing well since he was here last. Does not feel any different. He doesn't feel like anything is "going on in his chest". Rare cough with minimal sputum. Denies any hemoptysis, fevers, chills, night sweats, anorexia. No further weight loss.   Allergies  Allergen Reactions   Pregabalin Nausea And Vomiting    Immunization History  Administered Date(s) Administered   Influenza-Unspecified 08/03/2012   Pneumococcal-Unspecified 02/22/2010   Tdap 09/03/2008    Past Medical History:  Diagnosis Date   Diabetes mellitus without complication (HCC)    OSA (obstructive sleep apnea)    not on CPAP    Tobacco History: Social History   Tobacco Use  Smoking Status Former   Packs/day: 2.00   Years: 20.00   Additional pack years: 0.00   Total pack years: 40.00   Types: Cigarettes   Quit date: 73   Years since quitting: 40.4  Smokeless Tobacco Never   Counseling given: Not Answered   Outpatient Medications Prior to Visit  Medication Sig Dispense Refill   Alpha-Lipoic Acid 300 MG CAPS Take 300 mg by mouth 2 (two) times daily.     aspirin 81 MG tablet Take 81 mg by mouth daily.     atorvastatin (LIPITOR) 80 MG tablet one half tablet (40 mg dose).     brimonidine (ALPHAGAN) 0.2 % ophthalmic solution INSTILL 1 DROP IN BOTH EYES TWICE A DAY     cetirizine (ZYRTEC) 10 MG tablet TAKE ONE TABLET BY MOUTH DAILY AS NEEDED  FOR ALLERGY     Cholecalciferol 25 MCG (1000 UT) capsule Take 1,000 Units by mouth daily.     Co-Enzyme Q-10 100 MG CAPS Take 100 mg by mouth daily.     diclofenac (VOLTAREN) 75 MG EC tablet TAKE ONE TABLET BY MOUTH TWICE A DAY AS NEEDED FOR PAIN. STOP IBUPROFEN.     empagliflozin (JARDIANCE) 10 MG TABS tablet Take 5 mg by mouth daily.     gabapentin (NEURONTIN) 800 MG tablet Take 800 mg by mouth 3 (three) times daily.     hydrOXYzine (ATARAX) 10 MG tablet TAKE ONE TABLET BY MOUTH THREE TIMES A DAY AS  NEEDED FOR ANXIETY, SLEEP     insulin aspart protamine- aspart (NOVOLOG MIX 70/30) (70-30) 100 UNIT/ML injection Inject 60 Units into the skin 2 (two) times daily with a meal.     lisinopril (PRINIVIL,ZESTRIL) 40 MG tablet Take 40 mg by mouth daily.     metFORMIN (GLUCOPHAGE) 1000 MG tablet Take 1,000 mg by mouth 2 (two) times daily with a meal.     Multiple Vitamin (MULTIVITAMIN) capsule Take 1 capsule by mouth daily.     Omega-3 Fatty Acids (FISH OIL) 1000 MG CAPS Take 1,000 mg by mouth daily.     oxyCODONE (OXY IR/ROXICODONE) 5 MG immediate release tablet Take 1 pills every 4-6 hrs as needed for pain 30 tablet 0   polyethylene glycol (MIRALAX / GLYCOLAX) packet Take 17 g by mouth daily. 14 each 0   Semaglutide,0.25 or 0.5MG /DOS, 2 MG/3ML SOPN INJECT 0.5MG  SUBCUTANEOUSLY ONCE WEEKLY     tamsulosin (FLOMAX) 0.4 MG CAPS capsule Take 1 capsule by mouth every evening.     Testosterone 1.62 % GEL APPLY 2 PUMPS TO SKIN DAILY (APPLY AFTER SHOWER OR BATH TO CLEAN, DRY, INTACT SKIN ON UPPER ARM OR SHOULDER AREAS ONLY)     vitamin B-12 (CYANOCOBALAMIN) 500 MCG tablet Take 500 mcg by mouth daily.     enoxaparin (LOVENOX) 40 MG/0.4ML injection Inject 0.4 mLs (40 mg total) into the skin daily. 14 Syringe 1   No facility-administered medications prior to visit.     Review of Systems:   Constitutional: No weight loss or gain, night sweats, fevers, chills, fatigue, or lassitude. HEENT: No headaches, difficulty swallowing, tooth/dental problems, or sore throat. No sneezing, itching, ear ache, nasal congestion, or post nasal drip CV:  No chest pain, orthopnea, PND, swelling in lower extremities, anasarca, dizziness, palpitations, syncope Resp: +rare cough. No shortness of breath with exertion or at rest. No excess mucus or change in color of mucus. No hemoptysis. No wheezing.  No chest wall deformity GI:  No heartburn, indigestion Skin: No rash, lesions, ulcerations MSK:  No joint pain or swelling.   Neuro: No dizziness or lightheadedness.  Psych: No depression or anxiety. Mood stable.     Physical Exam:  BP (!) 112/58   Pulse 70   Temp 98.1 F (36.7 C) (Oral)   Ht 5\' 11"  (1.803 m)   Wt 240 lb (108.9 kg)   SpO2 94%   BMI 33.47 kg/m   GEN: Pleasant, interactive, well-kempt; obese; in no acute distress HEENT:  Normocephalic and atraumatic. PERRLA. Sclera white. Nasal turbinates pink, moist and patent bilaterally. No rhinorrhea present. Oropharynx pink and moist, without exudate or edema. No lesions, ulcerations, or postnasal drip.  NECK:  Supple w/ fair ROM. No JVD present. Normal carotid impulses w/o bruits. Thyroid symmetrical with no goiter or nodules palpated. No lymphadenopathy.   CV: RRR, no m/r/g, no  peripheral edema. Pulses intact, +2 bilaterally. No cyanosis, pallor or clubbing. PULMONARY:  Unlabored, regular breathing. Clear bilaterally A&P w/o wheezes/rales/rhonchi. No accessory muscle use.  GI: BS present and normoactive. Soft, non-tender to palpation. No organomegaly or masses detected. MSK: No erythema, warmth or tenderness. Cap refil <2 sec all extrem. No deformities or joint swelling noted.  Neuro: A/Ox3. No focal deficits noted.   Skin: Warm, no lesions or rashe Psych: Normal affect and behavior. Judgement and thought content appropriate.     Lab Results:  CBC    Component Value Date/Time   WBC 7.5 10/05/2018 0317   RBC 2.91 (L) 10/05/2018 0317   HGB 9.2 (L) 10/05/2018 0317   HCT 27.5 (L) 10/05/2018 0317   PLT 179 10/05/2018 0317   MCV 94.5 10/05/2018 0317   MCH 31.6 10/05/2018 0317   MCHC 33.5 10/05/2018 0317   RDW 15.3 10/05/2018 0317   LYMPHSABS 1.1 10/02/2018 0805   MONOABS 0.5 10/02/2018 0805   EOSABS 0.4 10/02/2018 0805   BASOSABS 0.1 10/02/2018 0805    BMET    Component Value Date/Time   NA 136 10/05/2018 0317   K 3.8 10/05/2018 0317   CL 100 10/05/2018 0317   CO2 28 10/05/2018 0317   GLUCOSE 222 (H) 10/05/2018 0317   BUN 26 (H)  10/05/2018 0317   CREATININE 1.03 10/05/2018 0317   CALCIUM 8.3 (L) 10/05/2018 0317   GFRNONAA >60 10/05/2018 0317   GFRAA >60 10/05/2018 0317    BNP No results found for: "BNP"   Imaging:  CT Chest Wo Contrast  Result Date: 02/06/2023 CLINICAL DATA:  History of prior COVID in March of 2024 EXAM: CT CHEST WITHOUT CONTRAST TECHNIQUE: Multidetector CT imaging of the chest was performed following the standard protocol without IV contrast. RADIATION DOSE REDUCTION: This exam was performed according to the departmental dose-optimization program which includes automated exposure control, adjustment of the mA and/or kV according to patient size and/or use of iterative reconstruction technique. COMPARISON:  11/28/2022 FINDINGS: Cardiovascular: Somewhat limited due to lack of IV contrast. Atherosclerotic calcifications are noted. Heavy coronary calcifications are seen. No cardiac enlargement is noted. Mediastinum/Nodes: Esophagus is within normal limits. No hilar or mediastinal adenopathy is noted. Some calcified right hilar nodes are seen consistent with prior granulomatous disease. A an isthmic hypodense nodule is noted measuring up to 3.8 cm transverse dimension. This is slightly more prominent than that seen on the prior exam. Lungs/Pleura: Lungs are well aerated bilaterally. A few calcified granulomas are seen. Stable lingular consolidation is noted with persistent mucous plugging associated with this consolidation. Given its chronicity low possibility of an endobronchial lesion would deserve consideration. The ground-glass nodule seen on prior CT examination are not well appreciated on today's exam although a new area of ground-glass density is noted in the right upper lobe measuring 10 mm in mean diameter is seen. This was not visualized on the prior exam. Upper Abdomen: Cholelithiasis is seen. Prominent extrarenal pelvis is noted on the right. No other upper abdominal abnormality is seen.  Musculoskeletal: Degenerative changes of the thoracic spine are noted. No acute rib abnormality is seen. No compression deformity is noted. IMPRESSION: Resolution of previously seen ground-glass densities in the left lung. Persistent lingular consolidation with bronchial plugging. Given the chronicity of this abnormality within the bronchus the possibility of an endobronchial lesion deserves consideration. Pulmonary consultation and bronchoscopy should be considered. Right ground-glass pulmonary nodule within the upper lobe measuring 10 mm. Per Fleischner Society Guidelines, recommend a non-contrast Chest  CT at 6-12 months to confirm persistence, then additional non-contrast Chest CTs every 2 years until 5 years. If nodule grows or develops solid component(s), consider resection. These guidelines do not apply to immunocompromised patients and patients with cancer. Follow up in patients with significant comorbidities as clinically warranted. For lung cancer screening, adhere to Lung-RADS guidelines. Reference: Radiology. 2017; 284(1):228-43. 3.8 cm hypodense thyroid nodule within the isthmus. Recommend nonemergent thyroid US (ref: J Am Coll Radiol. 2015 Feb;12(2): 143-50). Cholelithiasis without complicating factors. Aortic Atherosclerosis (ICD10-I70.0). These results will be called to the ordering clinician or representative by the Radiologist Assistant, and communication documented in the PACS or [employer]. Electronically Signed   By: Alcide Clever M.D.   On: 02/06/2023 21:03          No data to display          No results found for: "NITRICOXIDE"      Assessment & Plan:   Lung consolidation (HCC) Unclear etiology. First appreciated following COVID illness in March and initially felt to be infectious/inflammatory. Unfortunately, has not improved/resolved over the past 3 months. Unable to rule out endobronchial lesion. He will need bronchoscopy for further evaluation. Reviewed  risks/benefits of procedure. Pt was agreeable but also wanted to discuss with his PCP. Will ensure they have his records as well. Message sent to [facility] for scheduling with Dr. Wynona Neat.   Patient Instructions  There is an area in the lingula of your lung that continues to have decreased airflow to it and has some mucus plugging the airways. This has not improved or cleared up over the past few months, which is concerning and needs to be evaluated with a procedure called a bronchoscopy. We discussed this procedure today. Someone will contact you for scheduling.  You also have a 1 cm nodule in the right upper lobe of your lung that is new and will need follow up imaging in 3 months.   You had a spot on your thyroid that is 3.8 cm. Discuss this with your primary care provider since you have already had a thyroid ultrasound in the past and decide what needs to be done next. I will also send them a message  Follow up after your bronchoscopy with Dr. Wynona Neat or Florentina Addison Jaquasia Doscher,NP to discuss results. If symptoms worsen, please contact office for sooner follow up or seek emergency care.    Lung nodule 10 mm (1 cm) ground glass nodule in RUL, new. Will need follow up imaging in 3 months - will await scheduling until bronchoscopy results are received.   Thyroid nodule 3.8 cm. He believes he has had imaging of this already. Encouraged him to discuss with his PCP. Will also ensure they have his records.    I spent 38 minutes of dedicated to the care of this patient on the date of this encounter to include pre-visit review of records, face-to-face time with the patient discussing conditions above, post visit ordering of testing, clinical documentation with the electronic health record, making appropriate referrals as documented, and communicating necessary findings to members of the patients care team.  Noemi Chapel, NP 02/11/2023  Pt aware and understands NP's role.

## 2023-02-11 NOTE — H&P (View-Only) (Signed)
 @Patient ID: Johnathan Le, male    DOB: 09/24/1950, 72 y.o.   MRN: [id number]  Chief Complaint  Patient presents with   Follow-up    Ct review,     Referring provider: [facility]  HPI: 72 year old male, former smoker (40 pack year hx) followed for abnormal CT of the chest. He is a patient of Dr. Olalere's and last seen in office 12/25/2022. Past medical history significant for OSA, DM, HLD, allergic rhinitis, hypertension.  TEST/EVENTS:  02/01/2023 CT chest without contrast: Some calcified right hilar nodes, consistent with prior granulomatous disease.  Is make hypodense nodule measuring 3.8 cm, slightly more prominent than prior.  Lungs are well aerated bilaterally.  Few calcified granulomas.  Stable lingular consolidation with persistent mucous plugging.  New groundglass density in the right upper lobe measuring 10 mm.  Cholelithiasis.  Degenerative thoracic spine.  12/25/2022: OV with Dr. Olalere.  Had COVID recently.  Had a chest x-ray that was abnormal and subsequently had a CT of the chest.  CT of the chest showed atelectasis of the lingula.  Symptoms of COVID have resolved.  Very minimal cough.  Is limited with activities secondary to significant musculoskeletal limitations.  He has had some intentional weight loss with Ozempic.  No hemoptysis no chest pains or chest discomfort.  Not coughing up any secretions.  No pertinent occupational history.  Some exposure to different agents in the past.  Quit smoking over 40 years ago.  Treated for infection recently.  Plan to repeat CT chest in about 2 weeks to allow 6 weeks for resolution.  Possibly related to recent COVID infection.  May need to rule out endobronchial process if atelectasis persists.  02/11/2023: Today-follow-up Patient presents today for follow up to discuss CT imaging which shows a persistent consolidation in the lingula, concerning for possible endobronchial lesion. He also has a new RUL ground glass 1 cm pulmonary  nodule as well as a thyroid nodule. He tells me today that he has been doing well since he was here last. Does not feel any different. He doesn't feel like anything is "going on in his chest". Rare cough with minimal sputum. Denies any hemoptysis, fevers, chills, night sweats, anorexia. No further weight loss.   Allergies  Allergen Reactions   Pregabalin Nausea And Vomiting    Immunization History  Administered Date(s) Administered   Influenza-Unspecified 08/03/2012   Pneumococcal-Unspecified 02/22/2010   Tdap 09/03/2008    Past Medical History:  Diagnosis Date   Diabetes mellitus without complication (HCC)    OSA (obstructive sleep apnea)    not on CPAP    Tobacco History: Social History   Tobacco Use  Smoking Status Former   Packs/day: 2.00   Years: 20.00   Additional pack years: 0.00   Total pack years: 40.00   Types: Cigarettes   Quit date: 1984   Years since quitting: 40.4  Smokeless Tobacco Never   Counseling given: Not Answered   Outpatient Medications Prior to Visit  Medication Sig Dispense Refill   Alpha-Lipoic Acid 300 MG CAPS Take 300 mg by mouth 2 (two) times daily.     aspirin 81 MG tablet Take 81 mg by mouth daily.     atorvastatin (LIPITOR) 80 MG tablet one half tablet (40 mg dose).     brimonidine (ALPHAGAN) 0.2 % ophthalmic solution INSTILL 1 DROP IN BOTH EYES TWICE A DAY     cetirizine (ZYRTEC) 10 MG tablet TAKE ONE TABLET BY MOUTH DAILY AS NEEDED   FOR ALLERGY     Cholecalciferol 25 MCG (1000 UT) capsule Take 1,000 Units by mouth daily.     Co-Enzyme Q-10 100 MG CAPS Take 100 mg by mouth daily.     diclofenac (VOLTAREN) 75 MG EC tablet TAKE ONE TABLET BY MOUTH TWICE A DAY AS NEEDED FOR PAIN. STOP IBUPROFEN.     empagliflozin (JARDIANCE) 10 MG TABS tablet Take 5 mg by mouth daily.     gabapentin (NEURONTIN) 800 MG tablet Take 800 mg by mouth 3 (three) times daily.     hydrOXYzine (ATARAX) 10 MG tablet TAKE ONE TABLET BY MOUTH THREE TIMES A DAY AS  NEEDED FOR ANXIETY, SLEEP     insulin aspart protamine- aspart (NOVOLOG MIX 70/30) (70-30) 100 UNIT/ML injection Inject 60 Units into the skin 2 (two) times daily with a meal.     lisinopril (PRINIVIL,ZESTRIL) 40 MG tablet Take 40 mg by mouth daily.     metFORMIN (GLUCOPHAGE) 1000 MG tablet Take 1,000 mg by mouth 2 (two) times daily with a meal.     Multiple Vitamin (MULTIVITAMIN) capsule Take 1 capsule by mouth daily.     Omega-3 Fatty Acids (FISH OIL) 1000 MG CAPS Take 1,000 mg by mouth daily.     oxyCODONE (OXY IR/ROXICODONE) 5 MG immediate release tablet Take 1 pills every 4-6 hrs as needed for pain 30 tablet 0   polyethylene glycol (MIRALAX / GLYCOLAX) packet Take 17 g by mouth daily. 14 each 0   Semaglutide,0.25 or 0.5MG/DOS, 2 MG/3ML SOPN INJECT 0.5MG SUBCUTANEOUSLY ONCE WEEKLY     tamsulosin (FLOMAX) 0.4 MG CAPS capsule Take 1 capsule by mouth every evening.     Testosterone 1.62 % GEL APPLY 2 PUMPS TO SKIN DAILY (APPLY AFTER SHOWER OR BATH TO CLEAN, DRY, INTACT SKIN ON UPPER ARM OR SHOULDER AREAS ONLY)     vitamin B-12 (CYANOCOBALAMIN) 500 MCG tablet Take 500 mcg by mouth daily.     enoxaparin (LOVENOX) 40 MG/0.4ML injection Inject 0.4 mLs (40 mg total) into the skin daily. 14 Syringe 1   No facility-administered medications prior to visit.     Review of Systems:   Constitutional: No weight loss or gain, night sweats, fevers, chills, fatigue, or lassitude. HEENT: No headaches, difficulty swallowing, tooth/dental problems, or sore throat. No sneezing, itching, ear ache, nasal congestion, or post nasal drip CV:  No chest pain, orthopnea, PND, swelling in lower extremities, anasarca, dizziness, palpitations, syncope Resp: +rare cough. No shortness of breath with exertion or at rest. No excess mucus or change in color of mucus. No hemoptysis. No wheezing.  No chest wall deformity GI:  No heartburn, indigestion Skin: No rash, lesions, ulcerations MSK:  No joint pain or swelling.   Neuro: No dizziness or lightheadedness.  Psych: No depression or anxiety. Mood stable.     Physical Exam:  BP (!) 112/58   Pulse 70   Temp 98.1 F (36.7 C) (Oral)   Ht 5' 11" (1.803 m)   Wt 240 lb (108.9 kg)   SpO2 94%   BMI 33.47 kg/m   GEN: Pleasant, interactive, well-kempt; obese; in no acute distress HEENT:  Normocephalic and atraumatic. PERRLA. Sclera white. Nasal turbinates pink, moist and patent bilaterally. No rhinorrhea present. Oropharynx pink and moist, without exudate or edema. No lesions, ulcerations, or postnasal drip.  NECK:  Supple w/ fair ROM. No JVD present. Normal carotid impulses w/o bruits. Thyroid symmetrical with no goiter or nodules palpated. No lymphadenopathy.   CV: RRR, no m/r/g, no   peripheral edema. Pulses intact, +2 bilaterally. No cyanosis, pallor or clubbing. PULMONARY:  Unlabored, regular breathing. Clear bilaterally A&P w/o wheezes/rales/rhonchi. No accessory muscle use.  GI: BS present and normoactive. Soft, non-tender to palpation. No organomegaly or masses detected. MSK: No erythema, warmth or tenderness. Cap refil <2 sec all extrem. No deformities or joint swelling noted.  Neuro: A/Ox3. No focal deficits noted.   Skin: Warm, no lesions or rashe Psych: Normal affect and behavior. Judgement and thought content appropriate.     Lab Results:  CBC    Component Value Date/Time   WBC 7.5 10/05/2018 0317   RBC 2.91 (L) 10/05/2018 0317   HGB 9.2 (L) 10/05/2018 0317   HCT 27.5 (L) 10/05/2018 0317   PLT 179 10/05/2018 0317   MCV 94.5 10/05/2018 0317   MCH 31.6 10/05/2018 0317   MCHC 33.5 10/05/2018 0317   RDW 15.3 10/05/2018 0317   LYMPHSABS 1.1 10/02/2018 0805   MONOABS 0.5 10/02/2018 0805   EOSABS 0.4 10/02/2018 0805   BASOSABS 0.1 10/02/2018 0805    BMET    Component Value Date/Time   NA 136 10/05/2018 0317   K 3.8 10/05/2018 0317   CL 100 10/05/2018 0317   CO2 28 10/05/2018 0317   GLUCOSE 222 (H) 10/05/2018 0317   BUN 26 (H)  10/05/2018 0317   CREATININE 1.03 10/05/2018 0317   CALCIUM 8.3 (L) 10/05/2018 0317   GFRNONAA >60 10/05/2018 0317   GFRAA >60 10/05/2018 0317    BNP No results found for: "BNP"   Imaging:  CT Chest Wo Contrast  Result Date: 02/06/2023 CLINICAL DATA:  History of prior COVID in March of 2024 EXAM: CT CHEST WITHOUT CONTRAST TECHNIQUE: Multidetector CT imaging of the chest was performed following the standard protocol without IV contrast. RADIATION DOSE REDUCTION: This exam was performed according to the departmental dose-optimization program which includes automated exposure control, adjustment of the mA and/or kV according to patient size and/or use of iterative reconstruction technique. COMPARISON:  11/28/2022 FINDINGS: Cardiovascular: Somewhat limited due to lack of IV contrast. Atherosclerotic calcifications are noted. Heavy coronary calcifications are seen. No cardiac enlargement is noted. Mediastinum/Nodes: Esophagus is within normal limits. No hilar or mediastinal adenopathy is noted. Some calcified right hilar nodes are seen consistent with prior granulomatous disease. A an isthmic hypodense nodule is noted measuring up to 3.8 cm transverse dimension. This is slightly more prominent than that seen on the prior exam. Lungs/Pleura: Lungs are well aerated bilaterally. A few calcified granulomas are seen. Stable lingular consolidation is noted with persistent mucous plugging associated with this consolidation. Given its chronicity low possibility of an endobronchial lesion would deserve consideration. The ground-glass nodule seen on prior CT examination are not well appreciated on today's exam although a new area of ground-glass density is noted in the right upper lobe measuring 10 mm in mean diameter is seen. This was not visualized on the prior exam. Upper Abdomen: Cholelithiasis is seen. Prominent extrarenal pelvis is noted on the right. No other upper abdominal abnormality is seen.  Musculoskeletal: Degenerative changes of the thoracic spine are noted. No acute rib abnormality is seen. No compression deformity is noted. IMPRESSION: Resolution of previously seen ground-glass densities in the left lung. Persistent lingular consolidation with bronchial plugging. Given the chronicity of this abnormality within the bronchus the possibility of an endobronchial lesion deserves consideration. Pulmonary consultation and bronchoscopy should be considered. Right ground-glass pulmonary nodule within the upper lobe measuring 10 mm. Per Fleischner Society Guidelines, recommend a non-contrast Chest   CT at 6-12 months to confirm persistence, then additional non-contrast Chest CTs every 2 years until 5 years. If nodule grows or develops solid component(s), consider resection. These guidelines do not apply to immunocompromised patients and patients with cancer. Follow up in patients with significant comorbidities as clinically warranted. For lung cancer screening, adhere to Lung-RADS guidelines. Reference: Radiology. 2017; 284(1):228-43. 3.8 cm hypodense thyroid nodule within the isthmus. Recommend nonemergent thyroid US (ref: J Am Coll Radiol. 2015 Feb;12(2): 143-50). Cholelithiasis without complicating factors. Aortic Atherosclerosis (ICD10-I70.0). These results will be called to the ordering clinician or representative by the Radiologist Assistant, and communication documented in the PACS or [facility] Dashboard. Electronically Signed   By: Mark  Lukens M.D.   On: 02/06/2023 21:03          No data to display          No results found for: "NITRICOXIDE"      Assessment & Plan:   Lung consolidation (HCC) Unclear etiology. First appreciated following COVID illness in March and initially felt to be infectious/inflammatory. Unfortunately, has not improved/resolved over the past 3 months. Unable to rule out endobronchial lesion. He will need bronchoscopy for further evaluation. Reviewed  risks/benefits of procedure. Pt was agreeable but also wanted to discuss with his PCP. Will ensure they have his records as well. Message sent to [facility] for scheduling with Dr. Olalere.   Patient Instructions  There is an area in the lingula of your lung that continues to have decreased airflow to it and has some mucus plugging the airways. This has not improved or cleared up over the past few months, which is concerning and needs to be evaluated with a procedure called a bronchoscopy. We discussed this procedure today. Someone will contact you for scheduling.  You also have a 1 cm nodule in the right upper lobe of your lung that is new and will need follow up imaging in 3 months.   You had a spot on your thyroid that is 3.8 cm. Discuss this with your primary care provider since you have already had a thyroid ultrasound in the past and decide what needs to be done next. I will also send them a message  Follow up after your bronchoscopy with Dr. Olalere or Katie Krishana Lutze,NP to discuss results. If symptoms worsen, please contact office for sooner follow up or seek emergency care.    Lung nodule 10 mm (1 cm) ground glass nodule in RUL, new. Will need follow up imaging in 3 months - will await scheduling until bronchoscopy results are received.   Thyroid nodule 3.8 cm. He believes he has had imaging of this already. Encouraged him to discuss with his PCP. Will also ensure they have his records.    I spent 38 minutes of dedicated to the care of this patient on the date of this encounter to include pre-visit review of records, face-to-face time with the patient discussing conditions above, post visit ordering of testing, clinical documentation with the electronic health record, making appropriate referrals as documented, and communicating necessary findings to members of the patients care team.  Tenesia Escudero V Cully Luckow, NP 02/11/2023  Pt aware and understands NP's role.   

## 2023-02-11 NOTE — Assessment & Plan Note (Signed)
3.8 cm. He believes he has had imaging of this already. Encouraged him to discuss with his PCP. Will also ensure they have his records.

## 2023-02-11 NOTE — Patient Instructions (Addendum)
There is an area in the lingula of your lung that continues to have decreased airflow to it and has some mucus plugging the airways. This has not improved or cleared up over the past few months, which is concerning and needs to be evaluated with a procedure called a bronchoscopy. We discussed this procedure today. Someone will contact you for scheduling.  You also have a 1 cm nodule in the right upper lobe of your lung that is new and will need follow up imaging in 3 months.   You had a spot on your thyroid that is 3.8 cm. Discuss this with your primary care provider since you have already had a thyroid ultrasound in the past and decide what needs to be done next. I will also send them a message  Follow up after your bronchoscopy with Dr. Wynona Neat or Florentina Addison Leib Elahi,NP to discuss results. If symptoms worsen, please contact office for sooner follow up or seek emergency care.

## 2023-02-11 NOTE — Assessment & Plan Note (Addendum)
10 mm (1 cm) ground glass nodule in RUL, new. Will need follow up imaging in 3 months - will await scheduling until bronchoscopy results are received.

## 2023-02-11 NOTE — Assessment & Plan Note (Signed)
Unclear etiology. First appreciated following COVID illness in March and initially felt to be infectious/inflammatory. Unfortunately, has not improved/resolved over the past 3 months. Unable to rule out endobronchial lesion. He will need bronchoscopy for further evaluation. Reviewed risks/benefits of procedure. Pt was agreeable but also wanted to discuss with his PCP. Will ensure they have his records as well. Message sent to [facility] for scheduling with Dr. Wynona Neat.   Patient Instructions  There is an area in the lingula of your lung that continues to have decreased airflow to it and has some mucus plugging the airways. This has not improved or cleared up over the past few months, which is concerning and needs to be evaluated with a procedure called a bronchoscopy. We discussed this procedure today. Someone will contact you for scheduling.  You also have a 1 cm nodule in the right upper lobe of your lung that is new and will need follow up imaging in 3 months.   You had a spot on your thyroid that is 3.8 cm. Discuss this with your primary care provider since you have already had a thyroid ultrasound in the past and decide what needs to be done next. I will also send them a message  Follow up after your bronchoscopy with Dr. Wynona Neat or Florentina Addison Oluwadara Gorman,NP to discuss results. If symptoms worsen, please contact office for sooner follow up or seek emergency care.

## 2023-02-11 NOTE — Telephone Encounter (Signed)
I will need dot phrase to be completed so I will be able to give needed info to scheduler.  Also, Dr Wynona Neat I had forwarded an e-mail to you and the other providers to make sure you are aware covid testing is no longer required for bronchs- it is up to each individual provider.  They will do screening from hospital when they call pt a couple of days prior to procedure and will ask screening questions on day of procedure.  Are you ok with testing not being done?

## 2023-02-12 ENCOUNTER — Other Ambulatory Visit: Payer: Self-pay | Admitting: Pulmonary Disease

## 2023-02-12 DIAGNOSIS — J9811 Atelectasis: Secondary | ICD-10-CM

## 2023-02-12 NOTE — Progress Notes (Signed)
Please schedule the following:  Provider performing procedure:Ion Gonnella Diagnosis: Lingula atelectasis Which side if for nodule / mass? left Procedure: bronchoscopy  Has patient been spoken to by Provider and given informed consent? yes Anesthesia: general Do you need Fluro? no Duration of procedure: 20-30 min Date: June 14 Alternate Date: June 17, 18  Time: AM flexible/ PM flexible Location: [facility] if possible Does patient have OSA? no DM? yes Or Latex allergy? no Medication Restriction/ Anticoagulate/Antiplatelet: no Pre-op Labs Ordered:determined by Anesthesia Imaging request: none  (If, SuperDimension CT Chest, please have STAT courier sent to ENDO)  Please coordinate Pre-op COVID Testing

## 2023-02-13 NOTE — Telephone Encounter (Signed)
Referral was placed and Dr Wynona Neat was scheduled to do bronch on 6/18.  When I called pt to give him appt info he states can only do on a Friday and can not be this Friday.  I told him I would have to check with AO and room availabilitly.  I spoke to Misty Stanley who was working at [employer] today and she cancelled bronch and states only Friday available would be at [facility] on 7/5.  AO is working in the office all day that day.  Dr Wynona Neat - didn't know what you want to do about scheduling this patient.  Please advise.

## 2023-02-15 ENCOUNTER — Encounter: Payer: Self-pay | Admitting: Pulmonary Disease

## 2023-02-15 NOTE — Telephone Encounter (Signed)
Dr. Judeth Horn will be able to do it on 7/5 at [facility]  We can go ahead and schedule the procedure

## 2023-02-15 NOTE — Telephone Encounter (Signed)
Pt has been scheduled for bronch on 7/5.  Appt info is the referral.  Will close message.

## 2023-02-26 ENCOUNTER — Encounter (HOSPITAL_COMMUNITY): Payer: Self-pay | Admitting: Pulmonary Disease

## 2023-02-27 NOTE — Progress Notes (Signed)
Attempted to obtain medical history via telephone, unable to reach at this time. HIPAA compliant voicemail message left requesting return call to pre surgical testing department. 

## 2023-03-08 ENCOUNTER — Ambulatory Visit (HOSPITAL_COMMUNITY): Payer: No Typology Code available for payment source

## 2023-03-08 ENCOUNTER — Ambulatory Visit (HOSPITAL_BASED_OUTPATIENT_CLINIC_OR_DEPARTMENT_OTHER): Payer: No Typology Code available for payment source | Admitting: Anesthesiology

## 2023-03-08 ENCOUNTER — Encounter (HOSPITAL_COMMUNITY)
Admission: RE | Disposition: A | Discharge status: Home / Self Care | Payer: Self-pay | Source: Home / Self Care | Attending: Pulmonary Disease

## 2023-03-08 ENCOUNTER — Other Ambulatory Visit: Payer: Self-pay

## 2023-03-08 ENCOUNTER — Ambulatory Visit (HOSPITAL_COMMUNITY)
Admission: RE | Admit: 2023-03-08 | Discharge: 2023-03-08 | Disposition: A | Discharge status: Home / Self Care | Payer: No Typology Code available for payment source | Attending: Pulmonary Disease | Admitting: Pulmonary Disease

## 2023-03-08 ENCOUNTER — Encounter (HOSPITAL_COMMUNITY): Payer: Self-pay | Admitting: Pulmonary Disease

## 2023-03-08 ENCOUNTER — Ambulatory Visit (HOSPITAL_COMMUNITY): Payer: No Typology Code available for payment source | Admitting: Anesthesiology

## 2023-03-08 DIAGNOSIS — R918 Other nonspecific abnormal finding of lung field: Secondary | ICD-10-CM | POA: Diagnosis not present

## 2023-03-08 DIAGNOSIS — J181 Lobar pneumonia, unspecified organism: Secondary | ICD-10-CM | POA: Diagnosis not present

## 2023-03-08 DIAGNOSIS — I517 Cardiomegaly: Secondary | ICD-10-CM | POA: Diagnosis not present

## 2023-03-08 DIAGNOSIS — Z7985 Long-term (current) use of injectable non-insulin antidiabetic drugs: Secondary | ICD-10-CM | POA: Diagnosis not present

## 2023-03-08 DIAGNOSIS — E785 Hyperlipidemia, unspecified: Secondary | ICD-10-CM | POA: Insufficient documentation

## 2023-03-08 DIAGNOSIS — Z7984 Long term (current) use of oral hypoglycemic drugs: Secondary | ICD-10-CM | POA: Insufficient documentation

## 2023-03-08 DIAGNOSIS — Z48813 Encounter for surgical aftercare following surgery on the respiratory system: Secondary | ICD-10-CM | POA: Diagnosis not present

## 2023-03-08 DIAGNOSIS — E119 Type 2 diabetes mellitus without complications: Secondary | ICD-10-CM | POA: Diagnosis not present

## 2023-03-08 DIAGNOSIS — I10 Essential (primary) hypertension: Secondary | ICD-10-CM | POA: Insufficient documentation

## 2023-03-08 DIAGNOSIS — G4733 Obstructive sleep apnea (adult) (pediatric): Secondary | ICD-10-CM | POA: Diagnosis not present

## 2023-03-08 DIAGNOSIS — J9811 Atelectasis: Secondary | ICD-10-CM | POA: Insufficient documentation

## 2023-03-08 DIAGNOSIS — Z794 Long term (current) use of insulin: Secondary | ICD-10-CM | POA: Insufficient documentation

## 2023-03-08 DIAGNOSIS — Z79899 Other long term (current) drug therapy: Secondary | ICD-10-CM | POA: Insufficient documentation

## 2023-03-08 DIAGNOSIS — Z87891 Personal history of nicotine dependence: Secondary | ICD-10-CM | POA: Diagnosis not present

## 2023-03-08 HISTORY — PX: BRONCHIAL WASHINGS: SHX5105

## 2023-03-08 HISTORY — PX: BRONCHIAL BRUSHINGS: SHX5108

## 2023-03-08 HISTORY — PX: VIDEO BRONCHOSCOPY: SHX5072

## 2023-03-08 HISTORY — PX: BRONCHIAL BIOPSY: SHX5109

## 2023-03-08 HISTORY — PX: HEMOSTASIS CONTROL: SHX6838

## 2023-03-08 LAB — GLUCOSE, CAPILLARY
Glucose-Capillary: 120 mg/dL — ABNORMAL HIGH (ref 70–99)
Glucose-Capillary: 112 mg/dL — ABNORMAL HIGH (ref 70–99)

## 2023-03-08 SURGERY — VIDEO BRONCHOSCOPY WITHOUT FLUORO
Anesthesia: General

## 2023-03-08 MED ORDER — PROPOFOL 10 MG/ML IV BOLUS
INTRAVENOUS | Status: DC | PRN
  Administered 2023-03-08: 150 mg via INTRAVENOUS

## 2023-03-08 MED ORDER — LIDOCAINE HCL 1 % IJ SOLN
INTRAMUSCULAR | Status: DC | PRN
  Administered 2023-03-08: 10 mL via RESPIRATORY_TRACT

## 2023-03-08 MED ORDER — CHLORHEXIDINE GLUCONATE 0.12 % MT SOLN
15.0000 mL | Freq: Once | OROMUCOSAL | Status: AC
  Administered 2023-03-08: 15 mL via OROMUCOSAL

## 2023-03-08 MED ORDER — DEXAMETHASONE SODIUM PHOSPHATE 10 MG/ML IJ SOLN
INTRAMUSCULAR | Status: DC | PRN
  Administered 2023-03-08: 10 mg via INTRAVENOUS

## 2023-03-08 MED ORDER — CHLORHEXIDINE GLUCONATE 0.12 % MT SOLN
OROMUCOSAL | Status: AC
  Filled 2023-03-08: qty 15

## 2023-03-08 MED ORDER — ONDANSETRON HCL 4 MG/2ML IJ SOLN: 4.0000 mg | Freq: Once | INTRAMUSCULAR | Status: DC | PRN

## 2023-03-08 MED ORDER — FENTANYL CITRATE (PF) 100 MCG/2ML IJ SOLN
INTRAMUSCULAR | Status: AC
  Filled 2023-03-08: qty 2

## 2023-03-08 MED ORDER — ONDANSETRON HCL 4 MG/2ML IJ SOLN
INTRAMUSCULAR | Status: DC | PRN
  Administered 2023-03-08: 4 mg via INTRAVENOUS

## 2023-03-08 MED ORDER — LACTATED RINGERS IV SOLN: INTRAVENOUS | Status: DC

## 2023-03-08 MED ORDER — SUGAMMADEX SODIUM 200 MG/2ML IV SOLN
INTRAVENOUS | Status: DC | PRN
  Administered 2023-03-08: 200 mg via INTRAVENOUS

## 2023-03-08 MED ORDER — SUCCINYLCHOLINE CHLORIDE 200 MG/10ML IV SOSY
PREFILLED_SYRINGE | INTRAVENOUS | Status: DC | PRN
  Administered 2023-03-08: 120 mg via INTRAVENOUS

## 2023-03-08 MED ORDER — FENTANYL CITRATE (PF) 100 MCG/2ML IJ SOLN
INTRAMUSCULAR | Status: DC | PRN
  Administered 2023-03-08: 50 ug via INTRAVENOUS

## 2023-03-08 MED ORDER — LIDOCAINE HCL 1 % IJ SOLN
INTRAMUSCULAR | Status: AC
  Filled 2023-03-08: qty 20

## 2023-03-08 MED ORDER — ROCURONIUM BROMIDE 10 MG/ML (PF) SYRINGE
PREFILLED_SYRINGE | INTRAVENOUS | Status: DC | PRN
  Administered 2023-03-08: 20 mg via INTRAVENOUS

## 2023-03-08 MED ORDER — LIDOCAINE 2% (20 MG/ML) 5 ML SYRINGE
INTRAMUSCULAR | Status: DC | PRN
  Administered 2023-03-08: 100 mg via INTRAVENOUS

## 2023-03-08 NOTE — Op Note (Signed)
[facility] Cardiopulmonary Patient Name: Johnathan Le Procedure Date: 03/08/2023 MRN: [id number] Attending MD: Karren Burly MD, , [id number] Date of Birth: 24-May-1951 CSN: [id number] Age: 72 Admit Type: Outpatient Ethnicity: Not Hispanic or Latino Procedure:             Bronchoscopy Indications:           Atelectasis of the left upper lobe, Persistent                         atelectasis Providers:             Lesia Sago. Fawna Cranmer MD, Adolph Pollack, RN, Adin Hector, RN, Leanne Lovely, Technician Referring MD:          Minna Antis Olalere MD, MD (Referring MD) Medicines:             General Anesthesia, Monitored Anesthesia Care,                         Lidocaine 1% applied to the tracheobronchial tree 10                         mL, See the Anesthesia note for documentation of the                         administered medications Complications:         No immediate complications Estimated Blood Loss:  Estimated blood loss was minimal. Procedure:      Pre-Anesthesia Assessment:      - Universal Protocol:      - Pre-procedure Verification: Prior to the procedure, the patient's       identity was verified by full name, date of birth and medical record       number. The patient's identity was verified on all pertinent medical       records. Also prior to the procedure, a History and Physical was       performed, and patient medications, allergies and sensitivities were       reviewed. The patient's tolerance of previous anesthesia was reviewed.       The risks and benefits of the procedure and the sedation options and       risks were discussed with the patient. All questions were answered and       informed consent was obtained.      - Time-Out: Prior to the start of the procedure, the patient's       identification, proposed procedure, accurate signed consent, correctly       labeled images and records, and need for prophylactic  antibiotics were       verified by the physician, the nurse, the anesthetist and the technician       in the endoscopy suite.      - Airway Examination: Mallampati Class II (the uvula but not tonsillar       pillars visualized).      - ASA Grade Assessment: II - A patient with mild systemic disease.      After obtaining informed consent, the bronchoscope was passed under       direct vision. Throughout the procedure, the patient's blood pressure,       pulse, and oxygen  saturations were monitored continuously. the [id number]       ([id number]) Olympus bronchoscope was introduced through the mouth, via the       endotracheal tube and advanced to the tracheobronchial tree. The       procedure was accomplished without difficulty. The patient tolerated the       procedure well. The total duration of the procedure was 12 minutes. Findings:      Respiratory tract: The larynx is normal. The vocal cords appear normal.       The subglottic space is normal. The trachea is of normal caliber. The       carina is sharp. The entire tracheobronchial tree was examined to at       least the first subsegmental level. Bronchial mucosa and anatomy are       normal; there are no endobronchial lesions and no secretions, except in       the superior lingular segment of the left upper lobe. A portruding       highly vascularized endobronchial mass was blocking entire lingular       takeoff with adherent what appeared to be mucous. Forceps were used to       biopsy and remove the white substance. It came away intact with adherent       tissue from mass. This was sent to surgical pathology. There was       blleding from the mass. The area was brushed and this was sent for       cytology. Bleeding at low level was ongoing. Given vascular appearnce of       mass, no further biopsies were taken. 60 cc iced sakine were adminstered       after scope was "wedged" at superior aspect of mass weher most bleedign       was  observed. This was aspirated with return of 5 cc saline and sent for       cytolgy. It appeared hemostasis had been obtained after observation.       Scope was removed and procedure was terminated. Impression:      - Atelectasis of the left upper lobe      - Persistent atelectasis      - Endobrochial mass lingula take off      - Biopsy, brushing, washing at site of lesion Moderate Sedation:      N/A: per anesthesia care Recommendation:      - Discharge patient to home (ambulatory). Procedure Code(s):      --- Professional ---      [phone], Bronchoscopy, rigid or flexible, including fluoroscopic guidance,       when performed; diagnostic, with cell washing, when performed (separate       procedure) Diagnosis Code(s):      --- Professional ---      J98.11, Atelectasis CPT copyright 2022 American Medical Association. All rights reserved. The codes documented in this report are preliminary and upon coder review may  be revised to meet current compliance requirements. [facility] Karren Burly MD,  03/08/2023 11:32:52 AM Number of Addenda: 0 Scope In: Scope Out:

## 2023-03-08 NOTE — Brief Op Note (Signed)
03/08/2023  10:28 AM  PATIENT:  Johnathan Le  72 y.o. male  PRE-OPERATIVE DIAGNOSIS:  lingular atelectasis  POST-OPERATIVE DIAGNOSIS:  Lingular bx, brushings, washings, hemostasis control.  PROCEDURE:  Procedure(s): VIDEO BRONCHOSCOPY WITHOUT FLUORO (N/A) BRONCHIAL BIOPSIES BRONCHIAL BRUSHINGS HEMOSTASIS CONTROL BRONCHIAL WASHINGS  SURGEON:  Surgeon(s) and Role:    * Denver Bentson, Lesia Sago, MD - Primary  PHYSICIAN ASSISTANT:   ASSISTANTS: none   ANESTHESIA:    General anesthesia see anesthesia documentation  EBL:  5 ml   LOCAL MEDICATIONS USED:  LIDOCAINE  1% and Amount: 10 ml  SPECIMEN:  Source of Specimen:  Takeoff of lingular bronchus, Biopsy / Limited Resection, and Lavage/Washing  DISPOSITION OF SPECIMEN:   Pathology and cytology  PLAN OF CARE: Discharge to home after PACU  PATIENT DISPOSITION:  PACU - hemodynamically stable.   Delay start of Pharmacological VTE agent (>24hrs) due to surgical blood loss or risk of bleeding: not applicable

## 2023-03-08 NOTE — Anesthesia Procedure Notes (Signed)
Procedure Name: Intubation Date/Time: 03/08/2023 9:46 AM  Performed by: Doran Clay, CRNAPre-anesthesia Checklist: Patient identified, Emergency Drugs available, Patient being monitored, Suction available and Timeout performed Patient Re-evaluated:Patient Re-evaluated prior to induction Oxygen Delivery Method: Circle system utilized Preoxygenation: Pre-oxygenation with 100% oxygen Induction Type: IV induction and Rapid sequence Laryngoscope Size: Mac and 4 Grade View: Grade I Tube type: Oral Tube size: 8.5 mm Number of attempts: 1 Airway Equipment and Method: Stylet Placement Confirmation: ETT inserted through vocal cords under direct vision, positive ETCO2 and breath sounds checked- equal and bilateral Secured at: 23 cm Dental Injury: Teeth and Oropharynx as per pre-operative assessment

## 2023-03-08 NOTE — Op Note (Signed)
See Provation note

## 2023-03-08 NOTE — Anesthesia Preprocedure Evaluation (Signed)
Anesthesia Evaluation  Patient identified by MRN, date of birth, ID band Patient awake    Reviewed: Allergy & Precautions, H&P , NPO status , Patient's Chart, lab work & pertinent test results  Airway Mallampati: II  TM Distance: >3 FB Neck ROM: Full    Dental no notable dental hx.    Pulmonary sleep apnea , former smoker   Pulmonary exam normal breath sounds clear to auscultation       Cardiovascular negative cardio ROS Normal cardiovascular exam Rhythm:Regular Rate:Normal     Neuro/Psych negative neurological ROS  negative psych ROS   GI/Hepatic negative GI ROS, Neg liver ROS,,,  Endo/Other  diabetes    Renal/GU negative Renal ROS  negative genitourinary   Musculoskeletal negative musculoskeletal ROS (+)    Abdominal   Peds negative pediatric ROS (+)  Hematology negative hematology ROS (+)   Anesthesia Other Findings   Reproductive/Obstetrics negative OB ROS                             Anesthesia Physical Anesthesia Plan  ASA: 2  Anesthesia Plan: General   Post-op Pain Management: Minimal or no pain anticipated   Induction: Intravenous  PONV Risk Score and Plan: 2 and Ondansetron, Dexamethasone and Treatment may vary due to age or medical condition  Airway Management Planned: Oral ETT  Additional Equipment:   Intra-op Plan:   Post-operative Plan: Extubation in OR  Informed Consent: I have reviewed the patients History and Physical, chart, labs and discussed the procedure including the risks, benefits and alternatives for the proposed anesthesia with the patient or authorized representative who has indicated his/her understanding and acceptance.     Dental advisory given  Plan Discussed with: CRNA and Surgeon  Anesthesia Plan Comments:        Anesthesia Quick Evaluation

## 2023-03-08 NOTE — Transfer of Care (Signed)
Immediate Anesthesia Transfer of Care Note  Patient: Johnathan Le  Procedure(s) Performed: VIDEO BRONCHOSCOPY WITHOUT FLUORO BRONCHIAL BIOPSIES BRONCHIAL BRUSHINGS HEMOSTASIS CONTROL BRONCHIAL WASHINGS  Patient Location: PACU  Anesthesia Type:General  Level of Consciousness: sedated  Airway & Oxygen Therapy: Patient Spontanous Breathing and Patient connected to face mask oxygen  Post-op Assessment: Report given to RN and Post -op Vital signs reviewed and stable  Post vital signs: Reviewed and stable  Last Vitals:  Vitals Value Taken Time  BP    Temp    Pulse    Resp    SpO2      Last Pain:  Vitals:   03/08/23 0915  TempSrc: Temporal  PainSc: 0-No pain         Complications: No notable events documented.

## 2023-03-08 NOTE — Interval H&P Note (Signed)
History and Physical Interval Note:  03/08/2023 9:00 AM  Johnathan Le  has presented today for surgery, with the diagnosis of lingular atelectasis.  The various methods of treatment have been discussed with the patient and family. After consideration of risks, benefits and other options for treatment, the patient has consented to  Procedure(s): VIDEO BRONCHOSCOPY WITHOUT FLUORO (N/A) as a surgical intervention.  The patient's history has been reviewed, patient examined, no change in status, stable for surgery.  I have reviewed the patient's chart and labs.  Questions were answered to the patient's satisfaction.     Lesia Sago Vashawn Ekstein

## 2023-03-08 NOTE — Anesthesia Postprocedure Evaluation (Signed)
Anesthesia Post Note  Patient: Johnathan Le  Procedure(s) Performed: VIDEO BRONCHOSCOPY WITHOUT FLUORO BRONCHIAL BIOPSIES BRONCHIAL BRUSHINGS HEMOSTASIS CONTROL BRONCHIAL WASHINGS     Patient location during evaluation: PACU Anesthesia Type: General Level of consciousness: awake and alert Pain management: pain level controlled Vital Signs Assessment: post-procedure vital signs reviewed and stable Respiratory status: spontaneous breathing, nonlabored ventilation, respiratory function stable and patient connected to nasal cannula oxygen Cardiovascular status: blood pressure returned to baseline and stable Postop Assessment: no apparent nausea or vomiting Anesthetic complications: no  No notable events documented.  Last Vitals:  Vitals:   03/08/23 1100 03/08/23 1110  BP: (!) 106/47 135/60  Pulse: 78 75  Resp: 15 16  Temp:    SpO2: 95% 99%    Last Pain:  Vitals:   03/08/23 1110  TempSrc:   PainSc: 0-No pain                 Clinten Howk S

## 2023-03-11 ENCOUNTER — Encounter (HOSPITAL_COMMUNITY): Payer: Self-pay | Admitting: Pulmonary Disease

## 2023-03-11 LAB — CYTOLOGY - NON PAP

## 2023-03-11 LAB — SURGICAL PATHOLOGY

## 2023-03-12 ENCOUNTER — Telehealth: Payer: Self-pay | Admitting: Pulmonary Disease

## 2023-03-12 DIAGNOSIS — R918 Other nonspecific abnormal finding of lung field: Secondary | ICD-10-CM

## 2023-03-12 NOTE — Telephone Encounter (Signed)
Results of washing, brushing, single endobronchial biopsy nondiagnostic.  High suspicion for endobronchial tumor, bronchogenic carcinoma versus possible carcinoid.  Discussed results with patient's wife.  Discussed ongoing concern for malignancy based on appearance via bronchoscope.  Unable to contact patient via phone number listed.  Discussed my recommendation for repeat procedure at [facility] with cryo and additional tools, consideration of debridement etc.  She expressed understanding and is eager to move forward with this.  He currently takes a full dose aspirin daily.  Since hip surgery some years ago presumably an attempt to prevent postsurgical DVT.  Advised to stop this in anticipation of additional biopsies and to decrease risk of bleeding, okay to continue a baby aspirin if they would prefer to continue aspirin therapy.  Dr. Delton Coombes and Dr. Tonia Brooms, Would either of you be willing to perform bronchoscopy with possible debridement, additional endobronchial biopsies of mass arising from lingular takeoff at [facility]?  Images should be available in provation.  I do not think there is a need for a clinic visit in between as I have discussed in detail findings at time of bronchoscopy and via phone results and additional recommendations.  Will route to Dr. Wynona Neat to keep him in the loop.

## 2023-03-13 ENCOUNTER — Encounter: Payer: Self-pay | Admitting: Emergency Medicine

## 2023-03-13 ENCOUNTER — Telehealth: Payer: Self-pay | Admitting: Emergency Medicine

## 2023-03-13 NOTE — Telephone Encounter (Signed)
I have slots open on 7/16, could perform then if that works. I will place order to schedule then.

## 2023-03-13 NOTE — Telephone Encounter (Signed)
ERROR

## 2023-03-18 NOTE — Telephone Encounter (Signed)
Appreciate update.

## 2023-03-20 NOTE — Telephone Encounter (Signed)
We have him scheduled for bronchoscopy

## 2023-03-27 ENCOUNTER — Encounter (HOSPITAL_COMMUNITY): Payer: Self-pay | Admitting: Emergency Medicine

## 2023-03-27 ENCOUNTER — Other Ambulatory Visit: Payer: Self-pay

## 2023-03-27 NOTE — Progress Notes (Signed)
SDW call  Patient was given pre-op instructions over the phone. Patient verbalized understanding of instructions provided.     [facility] Cardiologist -  Pulmonary:    PPM/ICD - denies Device Orders - n/a Rep Notified - n/a   Chest x-ray - 03/08/2023 EKG -  DOS 03/29/2023 Stress Test - ECHO -  Cardiac Cath -   Sleep Study/sleep apnea/CPAP: + sleep apnea, does not wear a CPAP  Type II diabetic. Uses Freestyle Libre 3, right arm Fasting Blood sugar range: 180-200 How often check sugars: continuous Jardiance, states last dose was 03/26/2023 Ozempic, states last dose was 03/13/2023 Metformin, instructed to hold day of surgery Insulin aspart protamine (Novolog mix 70/30), instructed to use only 28 units the evening before (which is 70% of his regular dose and zero units the day of surgery.    Blood Thinner Instructions: denies Aspirin Instructions: States last dose was 03/26/2023   ERAS Protcol - No, NPO   COVID TEST- n/a    Anesthesia review: Yes. DM HTN, sleep apnea   Patient denies shortness of breath, fever, cough and chest pain over the phone call  Your procedure is scheduled on Friday March 29, 2023  Report to [facility] Main Entrance "A" at 0530  A.M., then check in with the Admitting office.  Call this number if you have problems the morning of surgery:  [phone]   If you have any questions prior to your surgery date call [phone]: Open Monday-Friday 8am-4pm If you experience any cold or flu symptoms such as cough, fever, chills, shortness of breath, etc. between now and your scheduled surgery, please notify us at the above number    Remember:  Do not eat or drink after midnight the night before your surgery  Take these medicines the morning of surgery with A SIP OF WATER:  Atorvastatin, alphagan eye drops, gabapentin  As needed: Zyrtec, hydroxyzine, oxy IR  As of today, STOP taking any Aleve, Naproxen, Ibuprofen, Motrin, Advil, Goody's, BC's,  all herbal medications, fish oil, and all vitamins. This includes your Voltaren

## 2023-03-29 ENCOUNTER — Ambulatory Visit (HOSPITAL_COMMUNITY): Payer: No Typology Code available for payment source

## 2023-03-29 ENCOUNTER — Other Ambulatory Visit: Payer: Self-pay

## 2023-03-29 ENCOUNTER — Ambulatory Visit (HOSPITAL_BASED_OUTPATIENT_CLINIC_OR_DEPARTMENT_OTHER): Payer: No Typology Code available for payment source

## 2023-03-29 ENCOUNTER — Encounter (HOSPITAL_COMMUNITY): Payer: Self-pay | Admitting: Emergency Medicine

## 2023-03-29 ENCOUNTER — Ambulatory Visit (HOSPITAL_COMMUNITY)
Admission: RE | Admit: 2023-03-29 | Discharge: 2023-03-29 | Disposition: A | Discharge status: Home / Self Care | Payer: No Typology Code available for payment source | Attending: Emergency Medicine | Admitting: Emergency Medicine

## 2023-03-29 ENCOUNTER — Encounter (HOSPITAL_COMMUNITY)
Admission: RE | Disposition: A | Discharge status: Home / Self Care | Payer: Self-pay | Source: Home / Self Care | Attending: Emergency Medicine

## 2023-03-29 DIAGNOSIS — Z7984 Long term (current) use of oral hypoglycemic drugs: Secondary | ICD-10-CM | POA: Diagnosis not present

## 2023-03-29 DIAGNOSIS — E119 Type 2 diabetes mellitus without complications: Secondary | ICD-10-CM

## 2023-03-29 DIAGNOSIS — Z87891 Personal history of nicotine dependence: Secondary | ICD-10-CM

## 2023-03-29 DIAGNOSIS — R911 Solitary pulmonary nodule: Secondary | ICD-10-CM | POA: Diagnosis not present

## 2023-03-29 DIAGNOSIS — I4510 Unspecified right bundle-branch block: Secondary | ICD-10-CM | POA: Insufficient documentation

## 2023-03-29 DIAGNOSIS — G4733 Obstructive sleep apnea (adult) (pediatric): Secondary | ICD-10-CM | POA: Insufficient documentation

## 2023-03-29 DIAGNOSIS — I10 Essential (primary) hypertension: Secondary | ICD-10-CM

## 2023-03-29 DIAGNOSIS — Z79890 Hormone replacement therapy (postmenopausal): Secondary | ICD-10-CM | POA: Insufficient documentation

## 2023-03-29 DIAGNOSIS — Z79899 Other long term (current) drug therapy: Secondary | ICD-10-CM | POA: Insufficient documentation

## 2023-03-29 DIAGNOSIS — D3A090 Benign carcinoid tumor of the bronchus and lung: Secondary | ICD-10-CM | POA: Insufficient documentation

## 2023-03-29 DIAGNOSIS — J989 Respiratory disorder, unspecified: Secondary | ICD-10-CM

## 2023-03-29 DIAGNOSIS — R918 Other nonspecific abnormal finding of lung field: Secondary | ICD-10-CM | POA: Diagnosis not present

## 2023-03-29 DIAGNOSIS — Z794 Long term (current) use of insulin: Secondary | ICD-10-CM | POA: Diagnosis not present

## 2023-03-29 HISTORY — DX: Essential (primary) hypertension: I10

## 2023-03-29 HISTORY — PX: BRONCHIAL BIOPSY: SHX5109

## 2023-03-29 HISTORY — PX: CRYOTHERAPY: SHX6894

## 2023-03-29 HISTORY — PX: VIDEO BRONCHOSCOPY: SHX5072

## 2023-03-29 HISTORY — PX: HEMOSTASIS CONTROL: SHX6838

## 2023-03-29 LAB — BASIC METABOLIC PANEL
Anion gap: 9 (ref 5–15)
BUN: 14 mg/dL (ref 8–23)
CO2: 23 mmol/L (ref 22–32)
Calcium: 8.6 mg/dL — ABNORMAL LOW (ref 8.9–10.3)
Chloride: 94 mmol/L — ABNORMAL LOW (ref 98–111)
Creatinine, Ser: 0.67 mg/dL (ref 0.61–1.24)
GFR, Estimated: >60 mL/min (ref >60)
Glucose, Bld: 177 mg/dL — ABNORMAL HIGH (ref 70–99)
Potassium: 4 mmol/L (ref 3.5–5.1)
Sodium: 126 mmol/L — ABNORMAL LOW (ref 135–145)

## 2023-03-29 LAB — CBC
HCT: 36.7 % — ABNORMAL LOW (ref 39.0–52.0)
Hemoglobin: 12.6 g/dL — ABNORMAL LOW (ref 13.0–17.0)
MCH: 29.9 pg (ref 26.0–34.0)
MCHC: 34.3 g/dL (ref 30.0–36.0)
MCV: 87.2 fL (ref 80.0–100.0)
Platelets: 198 10*3/uL (ref 150–400)
RBC: 4.21 MIL/uL — ABNORMAL LOW (ref 4.22–5.81)
RDW: 14.3 % (ref 11.5–15.5)
WBC: 7.7 10*3/uL (ref 4.0–10.5)
nRBC: 0 % (ref 0.0–0.2)

## 2023-03-29 LAB — GLUCOSE, CAPILLARY
Glucose-Capillary: 179 mg/dL — ABNORMAL HIGH (ref 70–99)
Glucose-Capillary: 205 mg/dL — ABNORMAL HIGH (ref 70–99)

## 2023-03-29 SURGERY — VIDEO BRONCHOSCOPY WITHOUT FLUORO
Anesthesia: General

## 2023-03-29 MED ORDER — LACTATED RINGERS IV SOLN: INTRAVENOUS | Status: DC

## 2023-03-29 MED ORDER — OXYCODONE HCL 5 MG/5ML PO SOLN: 5.0000 mg | Freq: Once | ORAL | Status: DC | PRN

## 2023-03-29 MED ORDER — PHENYLEPHRINE HCL-NACL 20-0.9 MG/250ML-% IV SOLN
INTRAVENOUS | Status: DC | PRN
  Administered 2023-03-29: 30 ug/min via INTRAVENOUS

## 2023-03-29 MED ORDER — FENTANYL CITRATE (PF) 100 MCG/2ML IJ SOLN
INTRAMUSCULAR | Status: AC
  Filled 2023-03-29: qty 2

## 2023-03-29 MED ORDER — SUGAMMADEX SODIUM 200 MG/2ML IV SOLN
INTRAVENOUS | Status: DC | PRN
  Administered 2023-03-29: 200 mg via INTRAVENOUS

## 2023-03-29 MED ORDER — ONDANSETRON HCL 4 MG/2ML IJ SOLN
INTRAMUSCULAR | Status: DC | PRN
  Administered 2023-03-29: 4 mg via INTRAVENOUS

## 2023-03-29 MED ORDER — ONDANSETRON HCL 4 MG/2ML IJ SOLN: 4.0000 mg | Freq: Four times a day (QID) | INTRAMUSCULAR | Status: DC | PRN

## 2023-03-29 MED ORDER — OXYCODONE HCL 5 MG PO TABS: 5.0000 mg | ORAL_TABLET | Freq: Once | ORAL | Status: DC | PRN

## 2023-03-29 MED ORDER — SODIUM CHLORIDE 0.9 % IV SOLN: INTRAVENOUS | Status: DC | PRN

## 2023-03-29 MED ORDER — SODIUM CHLORIDE (PF) 0.9 % IJ SOLN
PREFILLED_SYRINGE | INTRAMUSCULAR | Status: DC | PRN
  Administered 2023-03-29 (×5): 2 mL

## 2023-03-29 MED ORDER — FENTANYL CITRATE (PF) 250 MCG/5ML IJ SOLN
INTRAMUSCULAR | Status: DC | PRN
  Administered 2023-03-29: 100 ug via INTRAVENOUS

## 2023-03-29 MED ORDER — PROPOFOL 10 MG/ML IV BOLUS
INTRAVENOUS | Status: DC | PRN
Start: 2023-03-29 — End: 2023-03-29
  Administered 2023-03-29: 150 mg via INTRAVENOUS
  Administered 2023-03-29: 50 mg via INTRAVENOUS

## 2023-03-29 MED ORDER — LIDOCAINE 2% (20 MG/ML) 5 ML SYRINGE
INTRAMUSCULAR | Status: DC | PRN
  Administered 2023-03-29: 100 mg via INTRAVENOUS

## 2023-03-29 MED ORDER — ROCURONIUM BROMIDE 10 MG/ML (PF) SYRINGE
PREFILLED_SYRINGE | INTRAVENOUS | Status: DC | PRN
  Administered 2023-03-29: 50 mg via INTRAVENOUS

## 2023-03-29 MED ORDER — CHLORHEXIDINE GLUCONATE 0.12 % MT SOLN
15.0000 mL | Freq: Once | OROMUCOSAL | Status: AC
  Administered 2023-03-29: 15 mL via OROMUCOSAL
  Filled 2023-03-29: qty 15

## 2023-03-29 MED ORDER — INSULIN ASPART 100 UNIT/ML IJ SOLN: 0.0000 [IU] | INTRAMUSCULAR | Status: DC | PRN

## 2023-03-29 MED ORDER — FENTANYL CITRATE (PF) 100 MCG/2ML IJ SOLN: 25.0000 ug | INTRAMUSCULAR | Status: DC | PRN

## 2023-03-29 NOTE — Op Note (Signed)
[facility] Cardiopulmonary Patient Name: Johnathan Le Date: 03/29/2023 MRN: [id number] Attending MD: Leslye Peer , MD,  Date of Birth: 09/29/1950 CSN: Finalized Age: 72 Admit Type: Inpatient Gender: Male Procedure:             Bronchoscopy Indications:           Lingular mass Providers:             Leslye Peer, MD, Doristine Mango, RN, Priscella Mann, Technician Referring MD:          Leslye Peer, MD Medicines:              Complications:         No immediate complications Estimated Blood Loss:  Estimated blood loss: 40 mL requiring treatment with                         epinephrine. Procedure:             Pre-Anesthesia Assessment:                        - A History and Physical has been performed. Patient                         meds and allergies have been reviewed. The risks and                         benefits of the procedure and the sedation options and                         risks were discussed with the patient. All questions                         were answered and informed consent was obtained.                         Patient identification and proposed procedure were                         verified prior to the procedure by the physician in                         the pre-procedure area. Mental Status Examination:                         normal. Airway Examination: normal oropharyngeal                         airway. Respiratory Examination: clear to                         auscultation. CV Examination: regular rate and rhythm.                         Prior Anticoagulants: The patient has taken aspirin,  last dose was 2 days prior to procedure. ASA Grade                         Assessment: I - A normal healthy patient. After                         reviewing the risks and benefits, the patient was                         deemed in satisfactory condition to undergo the                          procedure. The anesthesia plan was to use general                         anesthesia. Immediately prior to administration of                         medications, the patient was re-assessed for adequacy                         to receive sedatives. The heart rate, respiratory                         rate, oxygen saturations, blood pressure, adequacy of                         pulmonary ventilation, and response to care were                         monitored throughout the procedure. The physical                         status of the patient was re-assessed after the                         procedure.                        After obtaining informed consent, the bronchoscope was                         passed under direct vision. Throughout the procedure,                         the patient's blood pressure, pulse, and oxygen                         saturations were monitored continuously. the [id number]                         ([id number]) Olympus bronchoscope was introduced through                         the mouth, via the endotracheal tube (the patient was                         intubated for the procedure) and advanced to the  tracheobronchial tree. The procedure was accomplished                         without difficulty. The patient tolerated the                         procedure well. Scope In: Scope Out: Findings:      The trachea is of normal caliber. The carina is sharp. The       tracheobronchial tree of the right lung was examined to at least the       first subsegmental level. Bronchial mucosa and anatomy in the right lung       are normal; there are no endobronchial lesions, and no secretions.      Left Lung Abnormalities: A nearly obstructing (greater than 90%       obstructed) mass was found proximally, at the orifice in the superior       lingular segment of the left upper lobe (B4) and in the inferior       lingular segment of the left upper  lobe (B5). The mass was medium-sized       and endobronchial, friable, polypoid and raised. The lesion was       successfully traversed. Endobronchial biopsies were performed in the       superior lingula segment of the left upper lobe and in the inferior       lingula segment of the left upper lobe using forceps and Cryo-Probe and       sent for histopathology examination. Cryotherapy was performed in the       superior lingula segment of the left upper lobe of the lung and in the       inferior lingula segment of the left upper lobe of the lung for ablation       of abnormal tissue and destruction of abnormal tissue. Post-treatment       findings: improved lumen size compared to before the procedure, lumen       size was 80% of normal, a moderate amount of residual abnormal mucosa       was present, hemostasis was achieved, mild oozing of blood noted and no       perforation occurred. Estimated blood loss: 40 mL, which was treated       with epinephrine. Impression:            - Lingular mass                        - The airway examination of the right lung was normal.                        - An endobronchial, friable, polypoid and raised mass                         was found in the superior lingular segment of the left                         upper lobe (B4) and in the inferior lingular segment                         of the left upper lobe (B5). This lesion is possibly  malignant.                        - An endobronchial biopsy was performed.                        - Cryotherapy was performed. Moderate Sedation:      Performed under general anesthesia Recommendation:        - Await biopsy and cytology results. Procedure Code(s):     --- Professional ---                        [phone], Bronchoscopy, rigid or flexible, including                         fluoroscopic guidance, when performed; with                         destruction of tumor or relief of stenosis  by any                         method other than excision (eg, laser therapy,                         cryotherapy)                        31625, 59, Bronchoscopy, rigid or flexible, including                         fluoroscopic guidance, when performed; with bronchial                         or endobronchial biopsy(s), single or multiple sites Diagnosis Code(s):     --- Professional ---                        R91.8, Other nonspecific abnormal finding of lung field                        J98.9, Respiratory disorder, unspecified CPT copyright 2022 American Medical Association. All rights reserved. The codes documented in this report are preliminary and upon coder review may  be revised to meet current compliance requirements. Leslye Peer, MD Leslye Peer, MD 03/29/2023 8:56:21 AM Number of Addenda: 0

## 2023-03-29 NOTE — H&P (Signed)
Johnathan Le is an 72 y.o. male.   Chief Complaint: Lingular mass HPI: 72 year old man, former smoker followed by Dr. Wynona Neat in our office for OSA, diabetes, hypertension.  He was found to have a lingular mass and some calcified right hilar nodes on CT scan of the chest.  He underwent inspection bronchoscopy by Dr. Judeth Horn but biopsies were unrevealing.  He returns now for repeat bronchoscopy, tissue sampling, possible debridement of the lesion if amenable.  He denies any chest symptoms, fevers, chills.  He understands the procedure, risk, benefits and rationale.  All questions answered.  Past Medical History:  Diagnosis Date   Diabetes mellitus without complication (HCC)    Hypertension    OSA (obstructive sleep apnea)    not on CPAP    Past Surgical History:  Procedure Laterality Date   BRONCHIAL BIOPSY  03/08/2023   Procedure: BRONCHIAL BIOPSIES;  Surgeon: Karren Burly, MD;  Location: [facility];  Service: Pulmonary;;   BRONCHIAL BRUSHINGS  03/08/2023   Procedure: BRONCHIAL BRUSHINGS;  Surgeon: Karren Burly, MD;  Location: [facility];  Service: Pulmonary;;   BRONCHIAL WASHINGS  03/08/2023   Procedure: BRONCHIAL WASHINGS;  Surgeon: Karren Burly, MD;  Location: [facility];  Service: Pulmonary;;   FEMUR IM NAIL Left 10/02/2018   Procedure: INTRAMEDULLARY (IM) NAIL FEMORAL;  Surgeon: Bjorn Pippin, MD;  Location: [facility] OR;  Service: Orthopedics;  Laterality: Left;   HEMOSTASIS CONTROL  03/08/2023   Procedure: HEMOSTASIS CONTROL;  Surgeon: Karren Burly, MD;  Location: [facility];  Service: Pulmonary;;   VIDEO BRONCHOSCOPY N/A 03/08/2023   Procedure: VIDEO BRONCHOSCOPY WITHOUT FLUORO;  Surgeon: Karren Burly, MD;  Location: [facility];  Service: Pulmonary;  Laterality: N/A;    History reviewed. No pertinent family history. Social History:  reports that he quit smoking about 40 years ago. His smoking use included cigarettes. He started smoking about 60  years ago. He has a 40 pack-year smoking history. He has never used smokeless tobacco. He reports that he does not currently use alcohol. He reports that he does not use drugs.  Allergies:  Allergies  Allergen Reactions   Pregabalin Nausea And Vomiting    Medications Prior to Admission  Medication Sig Dispense Refill   Alpha-Lipoic Acid 300 MG CAPS Take 300 mg by mouth 2 (two) times daily.     aspirin 81 MG tablet Take 81 mg by mouth daily.     atorvastatin (LIPITOR) 80 MG tablet one half tablet (40 mg dose).     brimonidine (ALPHAGAN) 0.2 % ophthalmic solution INSTILL 1 DROP IN BOTH EYES TWICE A DAY     cetirizine (ZYRTEC) 10 MG tablet TAKE ONE TABLET BY MOUTH DAILY AS NEEDED FOR ALLERGY     Cholecalciferol 25 MCG (1000 UT) capsule Take 1,000 Units by mouth daily.     Co-Enzyme Q-10 100 MG CAPS Take 100 mg by mouth daily.     empagliflozin (JARDIANCE) 10 MG TABS tablet Take 5 mg by mouth daily.     gabapentin (NEURONTIN) 800 MG tablet Take 800 mg by mouth 3 (three) times daily.     hydrOXYzine (ATARAX) 10 MG tablet TAKE ONE TABLET BY MOUTH THREE TIMES A DAY AS NEEDED FOR ANXIETY, SLEEP     insulin aspart protamine- aspart (NOVOLOG MIX 70/30) (70-30) 100 UNIT/ML injection Inject 40 Units into the skin 2 (two) times daily with a meal.     lisinopril (PRINIVIL,ZESTRIL) 40 MG tablet Take 40 mg by mouth daily.  metFORMIN (GLUCOPHAGE) 1000 MG tablet Take 1,000 mg by mouth 2 (two) times daily with a meal.     Multiple Vitamin (MULTIVITAMIN) capsule Take 1 capsule by mouth daily.     Omega-3 Fatty Acids (FISH OIL) 1000 MG CAPS Take 1,000 mg by mouth daily.     polyethylene glycol (MIRALAX / GLYCOLAX) packet Take 17 g by mouth daily. 14 each 0   tamsulosin (FLOMAX) 0.4 MG CAPS capsule Take 1 capsule by mouth every evening.     Testosterone 1.62 % GEL APPLY 2 PUMPS TO SKIN DAILY (APPLY AFTER SHOWER OR BATH TO CLEAN, DRY, INTACT SKIN ON UPPER ARM OR SHOULDER AREAS ONLY)     vitamin B-12  (CYANOCOBALAMIN) 500 MCG tablet Take 500 mcg by mouth daily.     diclofenac (VOLTAREN) 75 MG EC tablet TAKE ONE TABLET BY MOUTH TWICE A DAY AS NEEDED FOR PAIN. STOP IBUPROFEN.     enoxaparin (LOVENOX) 40 MG/0.4ML injection Inject 0.4 mLs (40 mg total) into the skin daily. (Patient not taking: Reported on 03/27/2023) 14 Syringe 1   oxyCODONE (OXY IR/ROXICODONE) 5 MG immediate release tablet Take 1 pills every 4-6 hrs as needed for pain 30 tablet 0   Semaglutide,0.25 or 0.5MG /DOS, 2 MG/3ML SOPN INJECT 0.5MG  SUBCUTANEOUSLY ONCE WEEKLY      Results for orders placed or performed during the hospital encounter of 03/29/23 (from the past 48 hour(s))  CBC per protocol     Status: Abnormal   Collection Time: 03/29/23  6:25 AM  Result Value Ref Range   WBC 7.7 4.0 - 10.5 K/uL   RBC 4.21 (L) 4.22 - 5.81 MIL/uL   Hemoglobin 12.6 (L) 13.0 - 17.0 g/dL   HCT 82.9 (L) 56.2 - 13.0 %   MCV 87.2 80.0 - 100.0 fL   MCH 29.9 26.0 - 34.0 pg   MCHC 34.3 30.0 - 36.0 g/dL   RDW 86.5 78.4 - 69.6 %   Platelets 198 150 - 400 K/uL   nRBC 0.0 0.0 - 0.2 %    Comment: Performed at [facility], [address] [place], [place] [address]  Basic metabolic panel per protocol     Status: Abnormal   Collection Time: 03/29/23  6:25 AM  Result Value Ref Range   Sodium 126 (L) 135 - 145 mmol/L   Potassium 4.0 3.5 - 5.1 mmol/L   Chloride 94 (L) 98 - 111 mmol/L   CO2 23 22 - 32 mmol/L   Glucose, Bld 177 (H) 70 - 99 mg/dL    Comment: Glucose reference range applies only to samples taken after fasting for at least 8 hours.   BUN 14 8 - 23 mg/dL   Creatinine, Ser 4.13 0.61 - 1.24 mg/dL   Calcium 8.6 (L) 8.9 - 10.3 mg/dL   GFR, Estimated >24 >40 mL/min    Comment: (NOTE) Calculated using the CKD-EPI Creatinine Equation (2021)    Anion gap 9 5 - 15    Comment: Performed at [facility], [address] [place], [place] [address]  Glucose, capillary     Status: Abnormal   Collection Time: 03/29/23  6:27 AM   Result Value Ref Range   Glucose-Capillary 179 (H) 70 - 99 mg/dL    Comment: Glucose reference range applies only to samples taken after fasting for at least 8 hours.   No results found.  Review of Systems As per HPI  Blood pressure (!) 157/71, pulse 73, temperature 98.1 F (36.7 C), temperature source  Oral, resp. rate 18, height 5\' 11"  (1.803 m), weight 106.6 kg, SpO2 93%. 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  No evidence of postobstructive pneumonia.  Plan for bronchoscopy, biopsies, debridement with cryotherapy.  Patient understands the risk, benefits.  No barriers identified.  Leslye Peer, MD 03/29/2023, 7:27 AM

## 2023-03-29 NOTE — Anesthesia Preprocedure Evaluation (Signed)
Anesthesia Evaluation  Patient identified by MRN, date of birth, ID band Patient awake    Reviewed: Allergy & Precautions, H&P , NPO status , Patient's Chart, lab work & pertinent test results  Airway Mallampati: II   Neck ROM: full    Dental   Pulmonary sleep apnea , former smoker   breath sounds clear to auscultation       Cardiovascular hypertension,  Rhythm:regular Rate:Normal     Neuro/Psych    GI/Hepatic   Endo/Other  diabetes, Type 2    Renal/GU      Musculoskeletal   Abdominal   Peds  Hematology   Anesthesia Other Findings   Reproductive/Obstetrics                             Anesthesia Physical Anesthesia Plan  ASA: 3  Anesthesia Plan: General   Post-op Pain Management:    Induction: Intravenous  PONV Risk Score and Plan: 2 and Ondansetron, Dexamethasone, Midazolam and Treatment may vary due to age or medical condition  Airway Management Planned: Oral ETT  Additional Equipment:   Intra-op Plan:   Post-operative Plan: Extubation in OR  Informed Consent: I have reviewed the patients History and Physical, chart, labs and discussed the procedure including the risks, benefits and alternatives for the proposed anesthesia with the patient or authorized representative who has indicated his/her understanding and acceptance.     Dental advisory given  Plan Discussed with: CRNA, Anesthesiologist and Surgeon  Anesthesia Plan Comments:        Anesthesia Quick Evaluation

## 2023-03-29 NOTE — Progress Notes (Signed)
Dr Jeanene Erb and patient does not need Chest xray post procedure.

## 2023-03-29 NOTE — Progress Notes (Signed)
Dr. Chaney Malling made aware that patient's BMP resulted and sodium level is 126. No new orders received at this time.

## 2023-03-29 NOTE — Discharge Instructions (Signed)
Flexible Bronchoscopy, Care After This sheet gives you information about how to care for yourself after your test. Your doctor may also give you more specific instructions. If you have problems or questions, contact your doctor. Follow these instructions at home: Eating and drinking When your numbness is gone and your cough and gag reflexes have come back, you may: Eat only soft foods. Slowly drink liquids. The day after the test, go back to your normal diet. Driving Do not drive for 24 hours if you were given a medicine to help you relax (sedative). Do not drive or use heavy machinery while taking prescription pain medicine. General instructions  Take over-the-counter and prescription medicines only as told by your doctor. Return to your normal activities as told. Ask what activities are safe for you. Do not use any products that have nicotine or tobacco in them. This includes cigarettes and e-cigarettes. If you need help quitting, ask your doctor. Keep all follow-up visits as told by your doctor. This is important. It is very important if you had a tissue sample (biopsy) taken. Get help right away if: You have shortness of breath that gets worse. You get light-headed. You feel like you are going to pass out (faint). You have chest pain. You cough up: More than a little blood. More blood than before. Summary Do not eat or drink anything (not even water) for 2 hours after your test, or until your numbing medicine wears off. Do not use cigarettes. Do not use e-cigarettes. Get help right away if you have chest pain.  Please call our office for any questions or concerns.  [phone].  Okay to restart your aspirin on 03/30/2023.  This information is not intended to replace advice given to you by your health care provider. Make sure you discuss any questions you have with your health care provider. Document Released: 06/17/2009 Document Revised: 08/02/2017 Document Reviewed:  09/07/2016 Elsevier Patient Education  2020 [employer].

## 2023-03-29 NOTE — Transfer of Care (Signed)
Immediate Anesthesia Transfer of Care Note  Patient: Johnathan Le  Procedure(s) Performed: VIDEO BRONCHOSCOPY WITHOUT FLUORO HEMOSTASIS CONTROL BRONCHIAL BIOPSIES CRYOTHERAPY  Patient Location: PACU  Anesthesia Type:General  Level of Consciousness: awake, alert , and oriented  Airway & Oxygen Therapy: Patient Spontanous Breathing  Post-op Assessment: Report given to RN and Post -op Vital signs reviewed and stable  Post vital signs: Reviewed and stable  Last Vitals:  Vitals Value Taken Time  BP 156/70 03/29/23 0845  Temp 36.7 C 03/29/23 0845  Pulse 78 03/29/23 0851  Resp 22 03/29/23 0851  SpO2 95 % 03/29/23 0851  Vitals shown include unfiled device data.  Last Pain:  Vitals:   03/29/23 0845  TempSrc:   PainSc: 0-No pain         Complications: There were no known notable events for this encounter.

## 2023-03-29 NOTE — Anesthesia Procedure Notes (Signed)
Procedure Name: Intubation Date/Time: 03/29/2023 7:53 AM  Performed by: Wilder Glade, CRNAPre-anesthesia Checklist: Patient identified, Emergency Drugs available, Suction available and Patient being monitored Patient Re-evaluated:Patient Re-evaluated prior to induction Oxygen Delivery Method: Circle system utilized Preoxygenation: Pre-oxygenation with 100% oxygen Induction Type: IV induction Ventilation: Mask ventilation without difficulty and Oral airway inserted - appropriate to patient size Laryngoscope Size: Mac and 4 Grade View: Grade II Tube type: Oral Tube size: 8.5 mm Number of attempts: 1 Airway Equipment and Method: Stylet and Oral airway Placement Confirmation: ETT inserted through vocal cords under direct vision, positive ETCO2 and breath sounds checked- equal and bilateral Tube secured with: Tape Dental Injury: Teeth and Oropharynx as per pre-operative assessment  Comments: Performed by Esau Grew

## 2023-03-30 NOTE — Anesthesia Postprocedure Evaluation (Signed)
Anesthesia Post Note  Patient: Mearl Kellom  Procedure(s) Performed: VIDEO BRONCHOSCOPY WITHOUT FLUORO HEMOSTASIS CONTROL BRONCHIAL BIOPSIES CRYOTHERAPY     Patient location during evaluation: PACU Anesthesia Type: General Level of consciousness: awake and alert Pain management: pain level controlled Vital Signs Assessment: post-procedure vital signs reviewed and stable Respiratory status: spontaneous breathing, nonlabored ventilation, respiratory function stable and patient connected to nasal cannula oxygen Cardiovascular status: blood pressure returned to baseline and stable Postop Assessment: no apparent nausea or vomiting Anesthetic complications: no   There were no known notable events for this encounter.  Last Vitals:  Vitals:   03/29/23 0900 03/29/23 0915  BP: (!) 140/63 128/68  Pulse: 81 84  Resp: 17 12  Temp:  36.7 C  SpO2: 92% 93%    Last Pain:  Vitals:   03/29/23 0915  TempSrc:   PainSc: 0-No pain                 Santina Trillo S

## 2023-04-01 ENCOUNTER — Encounter (HOSPITAL_COMMUNITY): Payer: Self-pay | Admitting: Emergency Medicine

## 2023-04-02 ENCOUNTER — Telehealth: Payer: Self-pay | Admitting: Emergency Medicine

## 2023-04-02 NOTE — Telephone Encounter (Signed)
I discussed the results with him.  His biopsies show typical carcinoid.  I will call the patient to discuss.

## 2023-04-03 NOTE — Telephone Encounter (Signed)
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  He will likely need surveillance imaging, may at some point need repeat bronchoscopy to visualize that airway.  Follow up w Dr Wynona Neat.

## 2023-04-18 ENCOUNTER — Encounter: Payer: Self-pay | Admitting: Pulmonary Disease

## 2023-04-22 ENCOUNTER — Ambulatory Visit (INDEPENDENT_AMBULATORY_CARE_PROVIDER_SITE_OTHER): Payer: No Typology Code available for payment source | Admitting: Pulmonary Disease

## 2023-04-22 ENCOUNTER — Encounter: Payer: Self-pay | Admitting: Pulmonary Disease

## 2023-04-22 VITALS — BP 122/68 | HR 68 | Ht 71.0 in | Wt 235.8 lb

## 2023-04-22 DIAGNOSIS — R911 Solitary pulmonary nodule: Secondary | ICD-10-CM | POA: Diagnosis not present

## 2023-04-22 NOTE — Patient Instructions (Signed)
I am glad you are better and your cough is improved Will make a follow-up with Dr. Wynona Neat in 6 months

## 2023-04-22 NOTE — Progress Notes (Signed)
Johnathan Le    [id number]    December 15, 1950  Primary Care Physician:[facility]  Referring Physician: [facility] [address] [place],  [place] [address]  Chief complaint: Acute visit for cough  HPI: 72 y.o. who  has a past medical history of Diabetes mellitus without complication (HCC), Hypertension, and OSA (obstructive sleep apnea).   He is a patient of Dr. Wynona Neat Underwent bronchoscopy on 03/08/2023 and 03/29/2023 for evaluation of abnormal CT and endobronchial lesion with findings on pathology of carcinoid. After his last procedure on 7/26 he had minor hemoptysis for a couple of days which self resolved.  Also has complaints of nonproductive cough since the procedure.  He made this visit to get evaluated but his symptoms have also self resolved a few days ago.  At present he feels clear with no respiratory complaints.  Outpatient Encounter Medications as of 04/22/2023  Medication Sig   Alpha-Lipoic Acid 300 MG CAPS Take 300 mg by mouth 2 (two) times daily.   aspirin 81 MG tablet Take 81 mg by mouth daily.   atorvastatin (LIPITOR) 80 MG tablet one half tablet (40 mg dose).   brimonidine (ALPHAGAN) 0.2 % ophthalmic solution INSTILL 1 DROP IN BOTH EYES TWICE A DAY   cetirizine (ZYRTEC) 10 MG tablet TAKE ONE TABLET BY MOUTH DAILY AS NEEDED FOR ALLERGY   Cholecalciferol 25 MCG (1000 UT) capsule Take 1,000 Units by mouth daily.   Co-Enzyme Q-10 100 MG CAPS Take 100 mg by mouth daily.   diclofenac (VOLTAREN) 75 MG EC tablet TAKE ONE TABLET BY MOUTH TWICE A DAY AS NEEDED FOR PAIN. STOP IBUPROFEN.   empagliflozin (JARDIANCE) 10 MG TABS tablet Take 5 mg by mouth daily.   gabapentin (NEURONTIN) 800 MG tablet Take 800 mg by mouth 3 (three) times daily.   hydrOXYzine (ATARAX) 10 MG tablet TAKE ONE TABLET BY MOUTH THREE TIMES A DAY AS NEEDED FOR ANXIETY, SLEEP   insulin aspart protamine- aspart (NOVOLOG MIX 70/30) (70-30) 100 UNIT/ML injection Inject 40 Units into the  skin 2 (two) times daily with a meal.   lisinopril (PRINIVIL,ZESTRIL) 40 MG tablet Take 40 mg by mouth daily.   metFORMIN (GLUCOPHAGE) 1000 MG tablet Take 1,000 mg by mouth 2 (two) times daily with a meal.   Multiple Vitamin (MULTIVITAMIN) capsule Take 1 capsule by mouth daily.   Omega-3 Fatty Acids (FISH OIL) 1000 MG CAPS Take 1,000 mg by mouth daily.   oxyCODONE (OXY IR/ROXICODONE) 5 MG immediate release tablet Take 1 pills every 4-6 hrs as needed for pain   polyethylene glycol (MIRALAX / GLYCOLAX) packet Take 17 g by mouth daily.   Semaglutide,0.25 or 0.5MG /DOS, 2 MG/3ML SOPN INJECT 0.5MG  SUBCUTANEOUSLY ONCE WEEKLY   tamsulosin (FLOMAX) 0.4 MG CAPS capsule Take 1 capsule by mouth every evening.   Testosterone 1.62 % GEL APPLY 2 PUMPS TO SKIN DAILY (APPLY AFTER SHOWER OR BATH TO CLEAN, DRY, INTACT SKIN ON UPPER ARM OR SHOULDER AREAS ONLY)   vitamin B-12 (CYANOCOBALAMIN) 500 MCG tablet Take 500 mcg by mouth daily.   No facility-administered encounter medications on file as of 04/22/2023.    Allergies as of 04/22/2023 - Review Complete 04/22/2023  Allergen Reaction Noted   Pregabalin Nausea And Vomiting 12/21/2011    Past Medical History:  Diagnosis Date   Diabetes mellitus without complication (HCC)    Hypertension    OSA (obstructive sleep apnea)    not on CPAP    Past Surgical  History:  Procedure Laterality Date   BRONCHIAL BIOPSY  03/08/2023   Procedure: BRONCHIAL BIOPSIES;  Surgeon: Karren Burly, MD;  Location: [facility] ENDOSCOPY;  Service: Pulmonary;;   BRONCHIAL BIOPSY  03/29/2023   Procedure: BRONCHIAL BIOPSIES;  Surgeon: Leslye Peer, MD;  Location: [facility] ENDOSCOPY;  Service: Pulmonary;;   BRONCHIAL BRUSHINGS  03/08/2023   Procedure: BRONCHIAL BRUSHINGS;  Surgeon: Karren Burly, MD;  Location: [facility] ENDOSCOPY;  Service: Pulmonary;;   BRONCHIAL WASHINGS  03/08/2023   Procedure: BRONCHIAL WASHINGS;  Surgeon: Karren Burly, MD;  Location: [facility] ENDOSCOPY;  Service:  Pulmonary;;   CRYOTHERAPY  03/29/2023   Procedure: CRYOTHERAPY;  Surgeon: Leslye Peer, MD;  Location: [facility] ENDOSCOPY;  Service: Pulmonary;;   FEMUR IM NAIL Left 10/02/2018   Procedure: INTRAMEDULLARY (IM) NAIL FEMORAL;  Surgeon: Bjorn Pippin, MD;  Location: [facility] OR;  Service: Orthopedics;  Laterality: Left;   HEMOSTASIS CONTROL  03/08/2023   Procedure: HEMOSTASIS CONTROL;  Surgeon: Karren Burly, MD;  Location: [facility] ENDOSCOPY;  Service: Pulmonary;;   HEMOSTASIS CONTROL  03/29/2023   Procedure: HEMOSTASIS CONTROL;  Surgeon: Leslye Peer, MD;  Location: [facility] ENDOSCOPY;  Service: Pulmonary;;   VIDEO BRONCHOSCOPY N/A 03/08/2023   Procedure: VIDEO BRONCHOSCOPY WITHOUT FLUORO;  Surgeon: Karren Burly, MD;  Location: [facility] ENDOSCOPY;  Service: Pulmonary;  Laterality: N/A;   VIDEO BRONCHOSCOPY N/A 03/29/2023   Procedure: VIDEO BRONCHOSCOPY WITHOUT FLUORO;  Surgeon: Leslye Peer, MD;  Location: [facility] ENDOSCOPY;  Service: Pulmonary;  Laterality: N/A;    No family history on file.  Social History   Socioeconomic History   Marital status: Married    Spouse name: Not on file   Number of children: Not on file   Years of education: Not on file   Highest education level: Not on file  Occupational History   Occupation: retired  Tobacco Use   Smoking status: Former    Current packs/day: 0.00    Average packs/day: 2.0 packs/day for 20.0 years (40.0 ttl pk-yrs)    Types: Cigarettes    Start date: 1964    Quit date: 1984    Years since quitting: 40.6   Smokeless tobacco: Never  Substance and Sexual Activity   Alcohol use: Not Currently   Drug use: No   Sexual activity: Not Currently  Other Topics Concern   Not on file  Social History Narrative   He is struggling. Due to his neuropathy, he had to stop riding his motorcycle.  He had done this for 50 years and misses it dearly.  He also was forced to retire from his job.  He is finding it difficult to cope with these life changes.   Social  Determinants of Health   Financial Resource Strain: Low Risk  (11/05/2022)   Received from [facility], [facility]   Overall Financial Resource Strain (CARDIA)    Difficulty of Paying Living Expenses: Not hard at all  Food Insecurity: No Food Insecurity (11/05/2022)   Received from [facility], [facility]   Hunger Vital Sign    Worried About Running Out of Food in the Last Year: Never true    Ran Out of Food in the Last Year: Never true  Transportation Needs: No Transportation Needs (11/05/2022)   Received from [facility], [facility]   PRAPARE - Transportation    Lack of Transportation (Medical): No    Lack of Transportation (Non-Medical): No  Physical Activity: Sufficiently Active (11/05/2022)   [facility]   Exercise Vital Sign    Days of Exercise per Week: 7 days    Minutes of Exercise per Session: 60 min  Stress: No Stress Concern Present (11/05/2022)   Received from [facility], [facility] - Occupational Stress Questionnaire    Feeling of Stress : Not at all  Social Connections: Socially Integrated (11/05/2022)   Received from [facility], [facility]   Social Network    How would you rate your social network (family, work, friends)?: Good participation with social networks  Intimate Partner Violence: Not At Risk (11/05/2022)   Received from [facility], [facility]   HITS    Over the last 12 months how often did your partner physically hurt you?: 1    Over the last 12 months how often did your partner insult you or talk down to you?: 1    Over the last 12 months how often did your partner threaten you with physical harm?: 1    Over the last 12 months how often did your partner scream or curse at you?: 1    Review of systems: Review of Systems  Constitutional: Negative for fever and chills.  HENT: Negative.   Eyes: Negative for blurred vision.  Respiratory: as per HPI   Cardiovascular: Negative for chest pain and palpitations.  Gastrointestinal: Negative for vomiting, diarrhea, blood per rectum. Genitourinary: Negative for dysuria, urgency, frequency and hematuria.  Musculoskeletal: Negative for myalgias, back pain and joint pain.  Skin: Negative for itching and rash.  Neurological: Negative for dizziness, tremors, focal weakness, seizures and loss of consciousness.  Endo/Heme/Allergies: Negative for environmental allergies.  Psychiatric/Behavioral: Negative for depression, suicidal ideas and hallucinations.  All other systems reviewed and are negative.  Physical Exam: Blood pressure 122/68, pulse 68, height 5\' 11"  (1.803 m), weight 235 lb 12.8 oz (107 kg), SpO2 98%. Gen:      No acute distress HEENT:  EOMI, sclera anicteric Neck:     No masses; no thyromegaly Lungs:    Clear to auscultation bilaterally; normal respiratory effort CV:         Regular rate and rhythm; no murmurs Abd:      + bowel sounds; soft, non-tender; no palpable masses, no distension Ext:    No edema; adequate peripheral perfusion Skin:      Warm and dry; no rash Neuro: alert and oriented x 3 Psych: normal mood and affect  Data Reviewed: Imaging: CT chest 02/06/2023- persistent lingular consolidation with bronchial plugging, right upper lobe groundglass nodule.  I have reviewed the images personally.  PFTs:  Labs:  Assessment/Plan:  Endobronchial carcinoid Cough, minor hemoptysis post bronchoscopy After his bronchoscopy on 7/26 he had mild hemoptysis and cough which has self resolved.  At present he is feeling normal and no intervention needed Follow-up with Dr. Wynona Neat in 6 months  Chilton Greathouse MD [facility] 04/22/2023, 4:15 PM  CC: [facility]
